# Patient Record
Sex: Male | Born: 1955
Health system: Southern US, Community
[De-identification: ages and names within clinical notes are randomized; demographics above are authoritative.]

## PROBLEM LIST (undated history)

## (undated) DIAGNOSIS — K219 Gastro-esophageal reflux disease without esophagitis: Secondary | ICD-10-CM

## (undated) DIAGNOSIS — K922 Gastrointestinal hemorrhage, unspecified: Secondary | ICD-10-CM

## (undated) DIAGNOSIS — K208 Other esophagitis without bleeding: Secondary | ICD-10-CM

## (undated) DIAGNOSIS — N1330 Unspecified hydronephrosis: Secondary | ICD-10-CM

## (undated) DIAGNOSIS — N179 Acute kidney failure, unspecified: Secondary | ICD-10-CM

## (undated) DIAGNOSIS — N400 Enlarged prostate without lower urinary tract symptoms: Secondary | ICD-10-CM

## (undated) DIAGNOSIS — C801 Malignant (primary) neoplasm, unspecified: Secondary | ICD-10-CM

## (undated) DIAGNOSIS — I10 Essential (primary) hypertension: Secondary | ICD-10-CM

## (undated) HISTORY — PX: TONSILLECTOMY: SUR1361

## (undated) HISTORY — DX: Unspecified hydronephrosis: N13.30

## (undated) HISTORY — DX: Acute kidney failure, unspecified: N17.9

## (undated) HISTORY — DX: Other esophagitis without bleeding: K20.80

## (undated) HISTORY — DX: Other esophagitis: K20.8

---

## 2006-09-14 DIAGNOSIS — N400 Enlarged prostate without lower urinary tract symptoms: Secondary | ICD-10-CM

## 2006-09-14 HISTORY — DX: Benign prostatic hyperplasia without lower urinary tract symptoms: N40.0

## 2017-01-08 ENCOUNTER — Inpatient Hospital Stay (HOSPITAL_COMMUNITY)
Admission: AD | Admit: 2017-01-08 | Discharge: 2017-01-14 | DRG: 392 | Disposition: A | Discharge status: Home / Self Care | Payer: Medicaid Other | Source: Other Acute Inpatient Hospital | Attending: Internal Medicine | Admitting: Internal Medicine

## 2017-01-08 ENCOUNTER — Inpatient Hospital Stay (HOSPITAL_COMMUNITY): Payer: Medicaid Other

## 2017-01-08 ENCOUNTER — Encounter (HOSPITAL_COMMUNITY): Payer: Self-pay

## 2017-01-08 DIAGNOSIS — I129 Hypertensive chronic kidney disease with stage 1 through stage 4 chronic kidney disease, or unspecified chronic kidney disease: Secondary | ICD-10-CM | POA: Diagnosis present

## 2017-01-08 DIAGNOSIS — Z833 Family history of diabetes mellitus: Secondary | ICD-10-CM | POA: Diagnosis not present

## 2017-01-08 DIAGNOSIS — R3989 Other symptoms and signs involving the genitourinary system: Secondary | ICD-10-CM | POA: Diagnosis present

## 2017-01-08 DIAGNOSIS — Z820 Family history of epilepsy and other diseases of the nervous system: Secondary | ICD-10-CM | POA: Diagnosis not present

## 2017-01-08 DIAGNOSIS — N183 Chronic kidney disease, stage 3 (moderate): Secondary | ICD-10-CM | POA: Diagnosis present

## 2017-01-08 DIAGNOSIS — E876 Hypokalemia: Secondary | ICD-10-CM | POA: Diagnosis not present

## 2017-01-08 DIAGNOSIS — Z791 Long term (current) use of non-steroidal anti-inflammatories (NSAID): Secondary | ICD-10-CM | POA: Diagnosis not present

## 2017-01-08 DIAGNOSIS — N320 Bladder-neck obstruction: Secondary | ICD-10-CM | POA: Diagnosis present

## 2017-01-08 DIAGNOSIS — K922 Gastrointestinal hemorrhage, unspecified: Secondary | ICD-10-CM | POA: Diagnosis not present

## 2017-01-08 DIAGNOSIS — K0889 Other specified disorders of teeth and supporting structures: Secondary | ICD-10-CM | POA: Diagnosis present

## 2017-01-08 DIAGNOSIS — R739 Hyperglycemia, unspecified: Secondary | ICD-10-CM | POA: Diagnosis present

## 2017-01-08 DIAGNOSIS — Z87891 Personal history of nicotine dependence: Secondary | ICD-10-CM

## 2017-01-08 DIAGNOSIS — K449 Diaphragmatic hernia without obstruction or gangrene: Secondary | ICD-10-CM | POA: Diagnosis present

## 2017-01-08 DIAGNOSIS — M549 Dorsalgia, unspecified: Secondary | ICD-10-CM | POA: Diagnosis present

## 2017-01-08 DIAGNOSIS — R590 Localized enlarged lymph nodes: Secondary | ICD-10-CM | POA: Diagnosis present

## 2017-01-08 DIAGNOSIS — N134 Hydroureter: Secondary | ICD-10-CM | POA: Diagnosis present

## 2017-01-08 DIAGNOSIS — N1330 Unspecified hydronephrosis: Secondary | ICD-10-CM | POA: Diagnosis present

## 2017-01-08 DIAGNOSIS — N400 Enlarged prostate without lower urinary tract symptoms: Secondary | ICD-10-CM | POA: Diagnosis present

## 2017-01-08 DIAGNOSIS — D72829 Elevated white blood cell count, unspecified: Secondary | ICD-10-CM | POA: Diagnosis present

## 2017-01-08 DIAGNOSIS — K210 Gastro-esophageal reflux disease with esophagitis: Secondary | ICD-10-CM | POA: Diagnosis present

## 2017-01-08 DIAGNOSIS — N401 Enlarged prostate with lower urinary tract symptoms: Secondary | ICD-10-CM | POA: Diagnosis present

## 2017-01-08 DIAGNOSIS — K209 Esophagitis, unspecified: Secondary | ICD-10-CM | POA: Diagnosis not present

## 2017-01-08 DIAGNOSIS — N179 Acute kidney failure, unspecified: Secondary | ICD-10-CM | POA: Diagnosis present

## 2017-01-08 DIAGNOSIS — K920 Hematemesis: Secondary | ICD-10-CM | POA: Diagnosis present

## 2017-01-08 DIAGNOSIS — R338 Other retention of urine: Secondary | ICD-10-CM | POA: Diagnosis present

## 2017-01-08 DIAGNOSIS — D62 Acute posthemorrhagic anemia: Secondary | ICD-10-CM | POA: Diagnosis present

## 2017-01-08 HISTORY — DX: Benign prostatic hyperplasia without lower urinary tract symptoms: N40.0

## 2017-01-08 HISTORY — DX: Essential (primary) hypertension: I10

## 2017-01-08 LAB — TYPE AND SCREEN
ABO/RH(D): O NEG
ANTIBODY SCREEN: NEGATIVE

## 2017-01-08 LAB — CBC WITH DIFFERENTIAL/PLATELET
Basophils Absolute: 0 10*3/uL (ref 0.0–0.1)
Basophils Relative: 0 %
EOS ABS: 0 10*3/uL (ref 0.0–0.7)
Eosinophils Relative: 0 %
HEMATOCRIT: 31.1 % — AB (ref 39.0–52.0)
HEMOGLOBIN: 10.9 g/dL — AB (ref 13.0–17.0)
LYMPHS ABS: 1.1 10*3/uL (ref 0.7–4.0)
Lymphocytes Relative: 7 %
MCH: 31 pg (ref 26.0–34.0)
MCHC: 35 g/dL (ref 30.0–36.0)
MCV: 88.4 fL (ref 78.0–100.0)
MONO ABS: 1.3 10*3/uL — AB (ref 0.1–1.0)
Monocytes Relative: 9 %
NEUTROS ABS: 13 10*3/uL — AB (ref 1.7–7.7)
NEUTROS PCT: 84 %
Platelets: 220 10*3/uL (ref 150–400)
RBC: 3.52 MIL/uL — ABNORMAL LOW (ref 4.22–5.81)
RDW: 12.3 % (ref 11.5–15.5)
WBC: 15.4 10*3/uL — ABNORMAL HIGH (ref 4.0–10.5)

## 2017-01-08 LAB — COMPREHENSIVE METABOLIC PANEL
ALK PHOS: 61 U/L (ref 38–126)
ALT: 18 U/L (ref 17–63)
ANION GAP: 15 (ref 5–15)
AST: 24 U/L (ref 15–41)
Albumin: 4.1 g/dL (ref 3.5–5.0)
BILIRUBIN TOTAL: 1.1 mg/dL (ref 0.3–1.2)
BUN: 56 mg/dL — ABNORMAL HIGH (ref 6–20)
CALCIUM: 9.5 mg/dL (ref 8.9–10.3)
CO2: 24 mmol/L (ref 22–32)
Chloride: 100 mmol/L — ABNORMAL LOW (ref 101–111)
Creatinine, Ser: 5.55 mg/dL — ABNORMAL HIGH (ref 0.61–1.24)
GFR calc non Af Amer: 10 mL/min — ABNORMAL LOW (ref >60)
GFR, EST AFRICAN AMERICAN: 12 mL/min — AB (ref >60)
Glucose, Bld: 131 mg/dL — ABNORMAL HIGH (ref 65–99)
Potassium: 3.8 mmol/L (ref 3.5–5.1)
Sodium: 139 mmol/L (ref 135–145)
TOTAL PROTEIN: 7.3 g/dL (ref 6.5–8.1)

## 2017-01-08 LAB — URINALYSIS, ROUTINE W REFLEX MICROSCOPIC
BACTERIA UA: NONE SEEN
BILIRUBIN URINE: NEGATIVE
Glucose, UA: NEGATIVE mg/dL
Ketones, ur: NEGATIVE mg/dL
Leukocytes, UA: NEGATIVE
Nitrite: NEGATIVE
Protein, ur: NEGATIVE mg/dL
SPECIFIC GRAVITY, URINE: 1.006 (ref 1.005–1.030)
SQUAMOUS EPITHELIAL / LPF: NONE SEEN
pH: 6 (ref 5.0–8.0)

## 2017-01-08 LAB — HEMOGLOBIN AND HEMATOCRIT, BLOOD
HCT: 31.1 % — ABNORMAL LOW (ref 39.0–52.0)
HCT: 30.8 % — ABNORMAL LOW (ref 39.0–52.0)
Hemoglobin: 10.9 g/dL — ABNORMAL LOW (ref 13.0–17.0)
Hemoglobin: 10.7 g/dL — ABNORMAL LOW (ref 13.0–17.0)

## 2017-01-08 LAB — ABO/RH: ABO/RH(D): O NEG

## 2017-01-08 LAB — PROTIME-INR
INR: 1.06
PROTHROMBIN TIME: 13.8 s (ref 11.4–15.2)

## 2017-01-08 LAB — HIV ANTIBODY (ROUTINE TESTING W REFLEX): HIV Screen 4th Generation wRfx: NONREACTIVE

## 2017-01-08 LAB — APTT: aPTT: 31 seconds (ref 24–36)

## 2017-01-08 MED ORDER — ALBUTEROL SULFATE (2.5 MG/3ML) 0.083% IN NEBU: 2.5000 mg | INHALATION_SOLUTION | RESPIRATORY_TRACT | Status: DC | PRN

## 2017-01-08 MED ORDER — ONDANSETRON HCL 4 MG PO TABS: 4.0000 mg | ORAL_TABLET | Freq: Four times a day (QID) | ORAL | Status: DC | PRN

## 2017-01-08 MED ORDER — PANTOPRAZOLE SODIUM 40 MG PO TBEC: 40.0000 mg | DELAYED_RELEASE_TABLET | Freq: Two times a day (BID) | ORAL | Status: DC

## 2017-01-08 MED ORDER — DEXTROSE 5 % IV SOLN
2.0000 g | INTRAVENOUS | Status: DC
  Administered 2017-01-08: 2 g via INTRAVENOUS
  Filled 2017-01-08: qty 2

## 2017-01-08 MED ORDER — SODIUM CHLORIDE 0.9 % IV SOLN
8.0000 mg/h | INTRAVENOUS | Status: DC
  Administered 2017-01-08: 8 mg/h via INTRAVENOUS
  Filled 2017-01-08 (×5): qty 80

## 2017-01-08 MED ORDER — ONDANSETRON HCL 4 MG/2ML IJ SOLN: 4.0000 mg | Freq: Four times a day (QID) | INTRAMUSCULAR | Status: DC | PRN

## 2017-01-08 MED ORDER — PANTOPRAZOLE SODIUM 40 MG IV SOLR: 40.0000 mg | Freq: Two times a day (BID) | INTRAVENOUS | Status: DC

## 2017-01-08 MED ORDER — PANTOPRAZOLE SODIUM 40 MG PO TBEC
40.0000 mg | DELAYED_RELEASE_TABLET | Freq: Two times a day (BID) | ORAL | Status: DC
  Administered 2017-01-09 – 2017-01-14 (×11): 40 mg via ORAL
  Filled 2017-01-08 (×11): qty 1

## 2017-01-08 MED ORDER — SODIUM CHLORIDE 0.9 % IV SOLN
8.0000 mg/h | INTRAVENOUS | Status: DC
  Administered 2017-01-08: 8 mg/h via INTRAVENOUS
  Filled 2017-01-08: qty 80

## 2017-01-08 MED ORDER — SODIUM CHLORIDE 0.9 % IV SOLN
INTRAVENOUS | Status: DC
  Administered 2017-01-08 – 2017-01-09 (×3): via INTRAVENOUS
  Administered 2017-01-09: 1 mL via INTRAVENOUS
  Administered 2017-01-10 (×2): via INTRAVENOUS

## 2017-01-08 NOTE — Care Management Note (Signed)
Case Management Note  Patient Details  Name: Todd Holden MRN: [id number] Date of Birth: [birth year]  Subjective/Objective:  GI bleed                  Action/Plan: Discharge Planning: NCM spoke to pt and lives at home with elderly mother. States he has follow up appt at [facility] in [place] on 01/19/2017 at 8:30 am. NCM encouraged pt to keep appt. Will check dc meds to see if [facility] letter needed for assistance with meds. Explained [place] program to pt. If meds are inexpensive, pt states he can afford. Please follow up with weekend NCM for assistance with [facility].    Expected Discharge Date:                 Expected Discharge Plan:  Home/Self Care  In-House Referral:  NA  Discharge planning Services  CM Consult, [place] Program  Post Acute Care Choice:  NA Choice offered to:  NA  DME Arranged:  N/A DME Agency:  NA  HH Arranged:  NA HH Agency:  NA  Status of Service:  In process, will continue to follow  If discussed at Long Length of Stay Meetings, dates discussed:    Additional Comments:  Erenest Rasher, RN 01/08/2017, 2:16 PM

## 2017-01-08 NOTE — H&P (Signed)
History and Physical    Todd Holden EXB:[id number] DOB: 11/30/1955 DOA: 01/08/2017  Referring MD/NP/PA: Dr. Myrene Buddy PCP: [facility] Not In System  Patient coming from: Transfer from [facility]  Chief Complaint: Vomiting black stuff  HPI: Todd Holden is a 61 y.o. male with medical history significant of HTN and BPH; who presents with complaints of 4-5 days of vomiting up black stuff. Patient notes that he's not been under the care of primary doctor in quite some time. He states that he's been vomiting black material at least 3 times per day. Unable to tolerate any oral food or liquids. During this time he reports having left-sided flank pain that he describes as throbbing and constant. He reports having issues with his prostate for some time. Reports normally only being able to urinate a small amount of urine at a time and is frequently up multiple times throughout the night. Here lately patient reports noticing a foul smell to his urine. Associated symptoms included generalized malaise, abdominal pain, subjective fevers, lightheadedness, take of this, and intermittent shortness of breath. Admits to use of [employer] brand Advil approximately 2 times per day for at least 1 week. Patient denies any alcohol, tobacco, or illicit drug use. He was initially seen at [facility] where patient was noted to have stable vital signs. Labs revealed increase 16.3, hemoglobin 11.5, BUN 55,  and creatinine 5. His baseline kidney function is unknown. His x-ray showed borderline cardiomegaly with no acute infiltrate or effusion. He received 80 mg bolus of Protonix and 1 L of IV fluids.The patient was transferred to [facility] to a med-surge bed for need of GI /nephrology consultative services.    ED Course: as seen above.  Review of Systems: As per HPI otherwise 10 point review of systems negative.   Past Medical History:  Diagnosis Date  . Hypertension   . Prostate enlargement 2008     Past Surgical History:  Procedure Laterality Date  . TONSILLECTOMY     61 years old     reports that he quit smoking about 40 years ago. His smoking use included Cigarettes. He smoked 1.00 pack per day. He has never used smokeless tobacco. He reports that he uses drugs, including Marijuana. He reports that he does not drink alcohol.  No Known Allergies  Family History  Problem Relation Age of Onset  . Diabetes Father   . Parkinson's disease Father     Prior to Admission medications   Not on File    Physical Exam: Constitutional: Elderly male who appears to be in mild distress repeatedly hiccuping  Vitals:   01/08/17 0056  BP: (!) 174/95  Pulse: 97  Resp: 18  Temp: 98.3 F (36.8 C)  TempSrc: Oral  SpO2: 99%  Weight: 86.2 kg (190 lb)  Height: 6\' 1"  (1.854 m)   Eyes: PERRL, lids and conjunctivae normal ENMT: Mucous membranes are dry, Posterior pharynx clear of any exudate or lesions. Poor dentition.  Neck: normal, supple, no masses, no thyromegaly Respiratory: clear to auscultation bilaterally, no wheezing, no crackles. Normal respiratory effort. No accessory muscle use.  Cardiovascular: Regular rate and rhythm, no murmurs / rubs / gallops. No extremity edema. 2+ pedal pulses. No carotid bruits.  Abdomen:  Positive left CVA tenderness, no masses palpated. No hepatosplenomegaly. Bowel sounds positive.  Musculoskeletal: no clubbing / cyanosis. No joint deformity upper and lower extremities. Good ROM, no contractures. Normal muscle tone.  Skin: no rashes, lesions, ulcers. No induration Neurologic: CN 2-12  grossly intact. Sensation intact, DTR normal. Strength 5/5 in all 4.  Psychiatric: Normal judgment and insight. Alert and oriented x 3. Normal mood.     Labs on Admission: I have personally reviewed following labs and imaging studies  CBC: No results for input(s): WBC, NEUTROABS, HGB, HCT, MCV, PLT in the last 168 hours. Basic Metabolic Panel: No results for  input(s): NA, K, CL, CO2, GLUCOSE, BUN, CREATININE, CALCIUM, MG, PHOS in the last 168 hours. GFR: CrCl cannot be calculated (No order found.). Liver Function Tests: No results for input(s): AST, ALT, ALKPHOS, BILITOT, PROT, ALBUMIN in the last 168 hours. No results for input(s): LIPASE, AMYLASE in the last 168 hours. No results for input(s): AMMONIA in the last 168 hours. Coagulation Profile: No results for input(s): INR, PROTIME in the last 168 hours. Cardiac Enzymes: No results for input(s): CKTOTAL, CKMB, CKMBINDEX, TROPONINI in the last 168 hours. BNP (last 3 results) No results for input(s): PROBNP in the last 8760 hours. HbA1C: No results for input(s): HGBA1C in the last 72 hours. CBG: No results for input(s): GLUCAP in the last 168 hours. Lipid Profile: No results for input(s): CHOL, HDL, LDLCALC, TRIG, CHOLHDL, LDLDIRECT in the last 72 hours. Thyroid Function Tests: No results for input(s): TSH, T4TOTAL, FREET4, T3FREE, THYROIDAB in the last 72 hours. Anemia Panel: No results for input(s): VITAMINB12, FOLATE, FERRITIN, TIBC, IRON, RETICCTPCT in the last 72 hours. Urine analysis: No results found for: COLORURINE, APPEARANCEUR, LABSPEC, PHURINE, GLUCOSEU, HGBUR, BILIRUBINUR, KETONESUR, PROTEINUR, UROBILINOGEN, NITRITE, LEUKOCYTESUR Sepsis Labs: No results found for this or any previous visit (from the past 240 hour(s)).   Radiological Exams on Admission: No results found.    Assessment/Plan Upper GI bleed with hematemesis: Acute. Patient presents with 5 days of hematemesis. Previous history of NSAID use noted. Patient currently hemodynamically stable. He'll be given 80 mg of Protonix IV 1 dose at outside facility. - Admit to MedSurg bed - NPO - Continuing protonix gtt per protocol - Zofran prn N/V - Will need to consult GI in a.m.  Acute blood loss anemia: Initial hemoglobin atOSF was noted to be 11.5. - Serial H&H's - Type and screen for possible need of blood  products  Leukocytosis with question of UTI/ pyelonephritis: Acute. WBC was initially noted to be elevated at 15 at the outside facility. No urinalysis was obtained. - Check stat urinalysis and urine culture - Empiric antibiotics of Rocephin for possible UTI, discontinue if no signs of infection - recheck CBC   Acute renal failure with urinary retention 2/2 BPH: Patient's initial creatinine noted to be elevated at 5 BUN of 55. No previous baseline known. - Place Foley catheter for suspected urinary retention - strict I&Os - I VF NS at 75 ml/hr  - Check renal ultrasound - May warrant formal consultation to nephrology at some point during hospitalization  DVT prophylaxis: SCD Code Status: Full Family Communication: No family present at bedside Disposition Plan: TBD  Consults called: None Admission status: Inpatient  Norval Morton MD [facility] Pager [phone]  If 7PM-7AM, please contact night-coverage [email or web address] Password [email or web address]  01/08/2017, 1:19 AM

## 2017-01-08 NOTE — Progress Notes (Signed)
Pharmacy Antibiotic Note  Todd Holden is a 61 y.o. male admitted on 01/08/2017 with UTI.  Pharmacy has been consulted for Ceftriaxone dosing. Rule out Pyelo.   Plan: -Ceftriaxone 2g IV q24h -F/U urine culture for directed therapy  Height: 6\' 1"  (185.4 cm) Weight: 190 lb (86.2 kg) IBW/kg (Calculated) : 79.9  Temp (24hrs), Avg:98.3 F (36.8 C), Min:98.3 F (36.8 C), Max:98.3 F (36.8 C)   [name] 01/08/2017 2:56 AM

## 2017-01-08 NOTE — Progress Notes (Signed)
Patient seen and evaluated earlier this a.m.   GI consulted and currently on board.  Patient evaluated and no new complaints reported.  We'll reassess next a.m.  Gen.: Patient in no acute distress, Cardiovascular: No cyanosis Pulmonary: No increased work of breathing, no wheezes  [place], [employer]

## 2017-01-08 NOTE — Consult Note (Signed)
[facility] Consult: 2:31 PM 01/08/2017  LOS: 0 days    Referring Provider: Dr Wendee Beavers  Primary Care Physician:   Primary Gastroenterologist:  unassigned   IMPRESSION:   *  Black, bloody emesis and black stools. Rule out ulcer disease, rule out neoplasia, rule out AVMs. Rule out Mallory-Weiss tear.  *  Normocytic anemia.  Is this from blood loss or due to what looks like CKD.    *  AKI. ?what element of his renal dysfunction is due to CKD?  *  Normocytic anemia  *  HTN  *  Hyperglycemia.  seerum glucoses in 120s - 130s.     PLAN:     *  EGD tomorrow as he had popsicle and a small amount of ginger ale within the last hour. .    *  Clears.  Switch to po Protonix in AM, has 10 hours left of Protonix drip (new bag just hung)   Azucena Freed  01/08/2017, 2:31 PM Pager: [place] Attending   I have taken an interval history, reviewed the chart and examined the patient. I agree with the Advanced Practitioner's note, impression and recommendations.    Certainly could be having coffee ground hematemesis   Reason for Consultation:  Black emesis   HPI: Todd Holden is a 61 y.o. male.  PMH HTN.   Prostamegaly.  No previous GI problems.  No previous EGD/Colonoscopy    This is the sixth day of pain in his left flank. Developed emesis on Monday. Initially this was clear but turned black in color and when he looked at the emesis in the commode there was a bloody/reddish cast to the color. This occurred 2-3 times a day throughout the week.  By Wednesday, 3 days into the vomiting, his stools became black and loose. At initial onset of the vomiting he felt vertigo but this resolved. He did not have chest pain, palpitations, shortness of breath. The left flank pain continued. He was unable to  make an appointment with the clinic in [place] until May. Yesterday evening he went to [facility] ED. BP 190s/90s, pulse 80s.  Hgb 11.5. Coags normal. LFTs normal.  Chemistries with either acute kidney injury and/or previously undiagnosed stage 4 CKD. He was not hyperkalemic.  Chest x-ray showed borderline cardiomegaly but no heart failure or pneumonia. He was transferred from [facility] to [place] for management of what was felt to be acute kidney failure.  Labs here at [facility]: Hgb 10.9 >> 10.7.  MCV 88.  Normal coags and platelets.   AKI vs CKD stage 4, GFR 10.    Patient uses ibuprofen 200 mg daily as needed for back pain and has used about 20 doses within the last month.  Patient does not suffer from heartburn. Up until this week his appetite was fine. There's been no weight loss. No NSAIDs.  ETOH at holidays 1 to 2 x per year.  No hx liver disease.  He does suffer from chronic prostate symptoms of dribbling urine, urinary urgency with ineffective voiding,  nocturia. He is known he's had prostate problems for several years. In terms of comparable labs, patient hasn't had any blood work in many years. He doesn't go to the doctor so he is unsure if he has previously undiagnosed hypertension.   Fm Hx negative for colorectal cancers, ulcer disease or GI bleeds.  Past Medical History:  Diagnosis Date  . Hypertension   . Prostate enlargement 2008    Past Surgical History:  Procedure Laterality Date  . TONSILLECTOMY     61 years old    Prior to Admission medications   Medication Sig Start Date End Date Taking? Authorizing Provider  ibuprofen (ADVIL,MOTRIN) 200 MG tablet Take 200-400 mg by mouth every 6 (six) hours as needed for headache or moderate pain.   Yes Historical Provider, MD    Scheduled Meds: . [START ON 01/11/2017] pantoprazole  40 mg Intravenous Q12H   Infusions: . sodium chloride 75 mL/hr at 01/08/17 0155  . pantoprozole (PROTONIX) infusion 8 mg/hr (01/08/17 0346)   PRN  Meds: albuterol, ondansetron **OR** ondansetron (ZOFRAN) IV   Allergies as of 01/07/2017  . (Not on File)    Family History  Problem Relation Age of Onset  . Diabetes Father   . Parkinson's disease Father     Social History   Social History  . Marital status: Divorced    Spouse name: N/A  . Number of children: N/A  . Years of education: N/A   Occupational History  . Not on file.   Social History Main Topics  . Smoking status: Former Smoker    Packs/day: 1.00    Types: Cigarettes    Quit date: 62  . Smokeless tobacco: Never Used  . Alcohol use No  . Drug use: Yes    Types: Marijuana     Comment: quit 06/2016   REVIEW OF SYSTEMS: Constitutional:  Some weakness. ENT:  No nose bleeds.  Periodic dental pain from poor dentition. Pulm:  No DOE, no cough CV:  No palpitations, no LE edema.  GU:  No hematuria, no frequency GI:  Per HPI Heme:  No unusual or excessive bleeding or bruising   Transfusions:  None ever Neuro:  No headaches, no peripheral tingling or numbness.  Resolve vertigo as per HPI Derm:  No itching, no rash or sores.  Endocrine:  No sweats or chills.  No polyuria or dysuria Immunization:  None recently Travel:  None beyond local counties in last few months.    PHYSICAL EXAM: Vital signs in last 24 hours: Vitals:   01/08/17 0511 01/08/17 1009  BP: (!) 187/91 (!) 163/90  Pulse: 99 87  Resp: 18 18  Temp: 98.3 F (36.8 C) 98.4 F (36.9 C)   Wt Readings from Last 3 Encounters:  01/08/17 86.2 kg (190 lb)    General: Pleasant, looks well. Head:  No asymmetry or swelling. No signs of head trauma.  Eyes:  No scleral icterus, no conjunctival pallor Ears:  No hearing deficit  Nose:  No congestion or discharge. Mouth:  Moist, clear oral mucosa. Moderately poor dentition. Tongue midline. Neck:  No JVD, thyromegaly or masses. Lungs:  Clear bilaterally. No shortness of breath or cough. Heart: RRR. No MRG. S1, S2 present. Abdomen:  Soft. Slight  lateral left tenderness. No masses. No HSM. Active bowel sounds. Not distended or obese.Marland Kitchen   Rectal: Deferred   Musc/Skeltl: No gross joint swelling or deformity. Extremities:  No CCE.  Neurologic:  Oriented times 3. No tremor. No limb weakness. No gross  deficits. Skin:  No sores. No telangiectasias. No suspicious lesions. Tattoos:  None Nodes:  No cervical adenopathy.   Psych:  Pleasant, calm, cooperative. Good historian.  Intake/Output from previous day: 04/26 0701 - 04/27 0700 In: 480.4 [I.V.:430.4; IV Piggyback:50] Out: 2300 [Urine:2300] Intake/Output this shift: Total I/O In: 0  Out: 3550 [Urine:3550]  LAB RESULTS:  Recent Labs  01/08/17 0257 01/08/17 0617 01/08/17 1223  WBC 15.4*  --   --   HGB 10.9* 10.9* 10.7*  HCT 31.1* 31.1* 30.8*  PLT 220  --   --    BMET Lab Results  Component Value Date   NA 139 01/08/2017   K 3.8 01/08/2017   CL 100 (L) 01/08/2017   CO2 24 01/08/2017   GLUCOSE 131 (H) 01/08/2017   BUN 56 (H) 01/08/2017   CREATININE 5.55 (H) 01/08/2017   CALCIUM 9.5 01/08/2017   LFT  Recent Labs  01/08/17 0257  PROT 7.3  ALBUMIN 4.1  AST 24  ALT 18  ALKPHOS 61  BILITOT 1.1   PT/INR Lab Results  Component Value Date   INR 1.06 01/08/2017     RADIOLOGY STUDIES: US Renal  Result Date: 01/08/2017 CLINICAL DATA:  Acute renal failure. EXAM: RENAL / URINARY TRACT ULTRASOUND COMPLETE COMPARISON:  None. FINDINGS: Right Kidney: Length: 12.9 cm. Echogenicity within normal limits. Moderate right-sided hydronephrosis is present. The proximal right ureter is dilated as well. No obstructing lesion is evident. Parenchymal echogenicity is within normal limits. Left Kidney: Length: 10.9 cm, within normal limits. Echogenicity within normal limits. No mass is visualized. A small amount of perinephric fluid is noted at the lower pole. Mild left-sided hydronephrosis is present. No obstructing lesion is evident. Bladder: A Foley catheter is present. IMPRESSION:  1. Bilateral hydronephrosis, right greater than left. 2. Mild perinephric fluid at the lower pole of the left kidney. 3. No obstructing lesions are visualized. 4. Foley catheter is present within the bladder. Electronically Signed   By: San Morelle M.D.   On: 01/08/2017 11:30

## 2017-01-09 ENCOUNTER — Encounter (HOSPITAL_COMMUNITY): Payer: Self-pay | Admitting: *Deleted

## 2017-01-09 ENCOUNTER — Encounter (HOSPITAL_COMMUNITY)
Admission: AD | Disposition: A | Discharge status: Home / Self Care | Payer: Self-pay | Source: Other Acute Inpatient Hospital | Attending: Family Medicine

## 2017-01-09 DIAGNOSIS — K922 Gastrointestinal hemorrhage, unspecified: Secondary | ICD-10-CM

## 2017-01-09 HISTORY — PX: ESOPHAGOGASTRODUODENOSCOPY: SHX5428

## 2017-01-09 HISTORY — DX: Gastrointestinal hemorrhage, unspecified: K92.2

## 2017-01-09 LAB — CBC
HEMATOCRIT: 31.5 % — AB (ref 39.0–52.0)
HEMOGLOBIN: 10.7 g/dL — AB (ref 13.0–17.0)
MCH: 30.7 pg (ref 26.0–34.0)
MCHC: 34 g/dL (ref 30.0–36.0)
MCV: 90.3 fL (ref 78.0–100.0)
PLATELETS: 230 10*3/uL (ref 150–400)
RBC: 3.49 MIL/uL — AB (ref 4.22–5.81)
RDW: 12.6 % (ref 11.5–15.5)
WBC: 12.2 10*3/uL — AB (ref 4.0–10.5)

## 2017-01-09 LAB — URINE CULTURE: Culture: NO GROWTH

## 2017-01-09 LAB — BASIC METABOLIC PANEL
ANION GAP: 10 (ref 5–15)
BUN: 34 mg/dL — ABNORMAL HIGH (ref 6–20)
CALCIUM: 8.9 mg/dL (ref 8.9–10.3)
CHLORIDE: 108 mmol/L (ref 101–111)
CO2: 24 mmol/L (ref 22–32)
Creatinine, Ser: 2.62 mg/dL — ABNORMAL HIGH (ref 0.61–1.24)
GFR calc Af Amer: 29 mL/min — ABNORMAL LOW (ref >60)
GFR calc non Af Amer: 25 mL/min — ABNORMAL LOW (ref >60)
GLUCOSE: 105 mg/dL — AB (ref 65–99)
Potassium: 3.3 mmol/L — ABNORMAL LOW (ref 3.5–5.1)
Sodium: 142 mmol/L (ref 135–145)

## 2017-01-09 SURGERY — EGD (ESOPHAGOGASTRODUODENOSCOPY)
Anesthesia: Moderate Sedation

## 2017-01-09 MED ORDER — BUTAMBEN-TETRACAINE-BENZOCAINE 2-2-14 % EX AERO
INHALATION_SPRAY | CUTANEOUS | Status: DC | PRN
  Administered 2017-01-09: 2 via TOPICAL

## 2017-01-09 MED ORDER — DIPHENHYDRAMINE HCL 50 MG/ML IJ SOLN
INTRAMUSCULAR | Status: DC | PRN
  Administered 2017-01-09: 25 mg via INTRAVENOUS

## 2017-01-09 MED ORDER — DIPHENHYDRAMINE HCL 50 MG/ML IJ SOLN
INTRAMUSCULAR | Status: AC
  Filled 2017-01-09: qty 1

## 2017-01-09 MED ORDER — SODIUM CHLORIDE 0.9 % IJ SOLN
PREFILLED_SYRINGE | INTRAMUSCULAR | Status: DC | PRN
  Administered 2017-01-09: 3 mL

## 2017-01-09 MED ORDER — FENTANYL CITRATE (PF) 100 MCG/2ML IJ SOLN
INTRAMUSCULAR | Status: AC
  Filled 2017-01-09: qty 4

## 2017-01-09 MED ORDER — EPINEPHRINE PF 1 MG/10ML IJ SOSY
PREFILLED_SYRINGE | INTRAMUSCULAR | Status: AC
  Filled 2017-01-09: qty 20

## 2017-01-09 MED ORDER — SPOT INK MARKER SYRINGE KIT
PACK | SUBMUCOSAL | Status: AC
  Filled 2017-01-09: qty 5

## 2017-01-09 MED ORDER — MIDAZOLAM HCL 5 MG/ML IJ SOLN
INTRAMUSCULAR | Status: AC
  Filled 2017-01-09: qty 2

## 2017-01-09 MED ORDER — FENTANYL CITRATE (PF) 100 MCG/2ML IJ SOLN
INTRAMUSCULAR | Status: DC | PRN
  Administered 2017-01-09 (×3): 25 ug via INTRAVENOUS

## 2017-01-09 MED ORDER — MIDAZOLAM HCL 10 MG/2ML IJ SOLN
INTRAMUSCULAR | Status: DC | PRN
  Administered 2017-01-09 (×2): 2 mg via INTRAVENOUS
  Administered 2017-01-09: 1 mg via INTRAVENOUS

## 2017-01-09 NOTE — Progress Notes (Signed)
Pt back from EGD.

## 2017-01-09 NOTE — Op Note (Signed)
[facility] Patient Name: Todd Holden Procedure Date : 01/09/2017 MRN: [id number] Attending MD: Carol Ada , MD Date of Birth: Jul 12, 1956 CSN: [id number] Age: 61 Admit Type: Inpatient Procedure:                Upper GI endoscopy Indications:              Hematemesis Providers:                Carol Ada, MD, Kingsley Plan, RN, Nevin Bloodgood, Technician, Cherylynn Ridges, Technician Referring MD:              Medicines:                Fentanyl 75 micrograms IV, Midazolam 5 mg IV,                            Diphenhydramine 25 mg IV Complications:            No immediate complications. Estimated Blood Loss:     Estimated blood loss was minimal. Procedure:                Pre-Anesthesia Assessment:                           - Prior to the procedure, a History and Physical                            was performed, and patient medications and                            allergies were reviewed. The patient's tolerance of                            previous anesthesia was also reviewed. The risks                            and benefits of the procedure and the sedation                            options and risks were discussed with the patient.                            All questions were answered, and informed consent                            was obtained. Prior Anticoagulants: The patient has                            taken no previous anticoagulant or antiplatelet                            agents. ASA Grade Assessment: II - A patient with  mild systemic disease. After reviewing the risks                            and benefits, the patient was deemed in                            satisfactory condition to undergo the procedure.                           - Sedation was administered by an endoscopy nurse.                            The sedation level attained was moderate.                           After obtaining  informed consent, the endoscope was                            passed under direct vision. Throughout the                            procedure, the patient's blood pressure, pulse, and                            oxygen saturations were monitored continuously. The                            EG-[id number] ([id number]) scope was introduced through the                            mouth, and advanced to the second part of duodenum.                            The upper GI endoscopy was accomplished without                            difficulty. The patient tolerated the procedure                            well. Scope In: Scope Out: Findings:      LA Grade D (one or more mucosal breaks involving at least 75% of       esophageal circumference) esophagitis with bleeding was found. Area was       successfully injected with 3 mL of a 1:10,000 solution of epinephrine       for hemostasis. For hemostasis, one hemostatic clip was successfully       placed (MR unsafe). There was no bleeding at the end of the procedure.      A 3 cm hiatal hernia was present.      The stomach was normal.      The examined duodenum was normal.      Initial entry into the esophagus revealed an LA Grade D esophagitis up       to 30 cm. The distal esophagus was very friable and with mild contact  with the endoscope arterial spurting resulted. The mucoa was very thin       and sloughed easily. I made decision to inject first to control the       bleeding. One ml of 1:10,000 Epi markedly slowed the bleeding. An       initial attempt to clip the area failed as the mucosa sloughed.       Acquisition of deeper tissue failed with this attempt. A second attempt       failed as the clip malfunctioned. A final third attempt was successful       in clipping the site. An additional 2 ml of Epi was injected adjacent to       the clip to further ensure hemostasis. Impression:               - LA Grade D reflux esophagitis. Injected. Clip  (MR                            unsafe) was placed.                           - 3 cm hiatal hernia.                           - Normal stomach.                           - Normal examined duodenum.                           - No specimens collected. Moderate Sedation:      Moderate (conscious) sedation was administered by the endoscopy nurse       and supervised by the endoscopist. The following parameters were       monitored: oxygen saturation, heart rate, blood pressure, and response       to care. Recommendation:           - Return patient to hospital ward for ongoing care.                           - NPO.                           - Continue present medications.                           - Follow HGB. Procedure Code(s):        --- Professional ---                           [phone], Esophagogastroduodenoscopy, flexible,                            transoral; with control of bleeding, any method Diagnosis Code(s):        --- Professional ---                           K21.0, Gastro-esophageal reflux disease with  esophagitis                           K44.9, Diaphragmatic hernia without obstruction or                            gangrene                           K92.0, Hematemesis CPT copyright 2016 American [employer] Association. All rights reserved. The codes documented in this report are preliminary and upon coder review may  be revised to meet current compliance requirements. Carol Ada, MD Carol Ada, MD 01/09/2017 8:32:14 AM This report has been signed electronically. Number of Addenda: 0

## 2017-01-09 NOTE — Progress Notes (Signed)
PROGRESS NOTE    Todd Holden  JKK:[id number] DOB: 31-May-1956 DOA: 01/08/2017 PCP: Pcp Not In System    Brief Narrative:  61 y.o. male with medical history significant of HTN and BPH; who presents with complaints of 4-5 days of vomiting up black stuff. Patient notes that he's not been under the care of primary doctor in quite some time. He states that he's been vomiting black material at least 3 times per day.  Assessment & Plan:   Principal Problem:   GI bleed - Pt is s/p EGD - Had clip placed. Plan is to monitor blood levels after procedure. GI managing diet - Pt on protonix  Active Problems:   Hematemesis - resolved    Acute blood loss anemia - Blood levels stable    Acute renal failure (ARF) (HCC) - Much improved.  - reassess next am.    BPH (benign prostatic hyperplasia) - stable    Leukocytosis - None   DVT prophylaxis: SCD's Code Status: Full Family Communication: None at bedside Disposition Plan: pending improvement in condition   Consultants:   GI   Procedures: EGD   Antimicrobials: None   Subjective: Pt has no new complaints. No acute issues overnight.  Objective: Vitals:   01/09/17 0820 01/09/17 0830 01/09/17 0835 01/09/17 1356  BP: (!) 176/72 (!) 148/61 (!) 154/77 139/78  Pulse: 99 100 98 85  Resp: 14 13 14 16   Temp: 98 F (36.7 C)   98 F (36.7 C)  TempSrc: Oral   Oral  SpO2: 100% 100% 100% 100%  Weight:      Height:        Intake/Output Summary (Last 24 hours) at 01/09/17 1656 Last data filed at 01/09/17 1428  Gross per 24 hour  Intake              927 ml  Output             3300 ml  Net            -2373 ml   Filed Weights   01/08/17 0056 01/09/17 0740  Weight: 86.2 kg (190 lb) 86.2 kg (190 lb)    Examination:  General exam: Appears calm and comfortable, in nad. Respiratory system: Clear to auscultation. Respiratory effort normal. Cardiovascular system: S1 & S2 heard, RRR. No JVD, murmurs, rubs, gallops or  clicks. No pedal edema. Gastrointestinal system: Abdomen is nondistended, soft and nontender.  Normal bowel sounds heard. Central nervous system: Alert and oriented. No focal neurological deficits. Extremities: Symmetric 5 x 5 power. Skin: No rashes, lesions or ulcers, on limited exam. Psychiatry: Judgement and insight appear normal. Mood & affect appropriate.    Data Reviewed: I have personally reviewed following labs and imaging studies  CBC:  Recent Labs Lab 01/08/17 0257 01/08/17 0617 01/08/17 1223 01/09/17 0554  WBC 15.4*  --   --  12.2*  NEUTROABS 13.0*  --   --   --   HGB 10.9* 10.9* 10.7* 10.7*  HCT 31.1* 31.1* 30.8* 31.5*  MCV 88.4  --   --  90.3  PLT 220  --   --  716   Basic Metabolic Panel:  Recent Labs Lab 01/08/17 0257 01/09/17 0554  NA 139 142  K 3.8 3.3*  CL 100* 108  CO2 24 24  GLUCOSE 131* 105*  BUN 56* 34*  CREATININE 5.55* 2.62*  CALCIUM 9.5 8.9   GFR: Estimated Creatinine Clearance: 33.9 mL/min (A) (by C-G formula based on SCr of 2.62  mg/dL (H)). Liver Function Tests:  Recent Labs Lab 01/08/17 0257  AST 24  ALT 18  ALKPHOS 61  BILITOT 1.1  PROT 7.3  ALBUMIN 4.1   No results for input(s): LIPASE, AMYLASE in the last 168 hours. No results for input(s): AMMONIA in the last 168 hours. Coagulation Profile:  Recent Labs Lab 01/08/17 0617  INR 1.06   Cardiac Enzymes: No results for input(s): CKTOTAL, CKMB, CKMBINDEX, TROPONINI in the last 168 hours. BNP (last 3 results) No results for input(s): PROBNP in the last 8760 hours. HbA1C: No results for input(s): HGBA1C in the last 72 hours. CBG: No results for input(s): GLUCAP in the last 168 hours. Lipid Profile: No results for input(s): CHOL, HDL, LDLCALC, TRIG, CHOLHDL, LDLDIRECT in the last 72 hours. Thyroid Function Tests: No results for input(s): TSH, T4TOTAL, FREET4, T3FREE, THYROIDAB in the last 72 hours. Anemia Panel: No results for input(s): VITAMINB12, FOLATE, FERRITIN,  TIBC, IRON, RETICCTPCT in the last 72 hours. Sepsis Labs: No results for input(s): PROCALCITON, LATICACIDVEN in the last 168 hours.  Recent Results (from the past 240 hour(s))  Urine culture     Status: None   Collection Time: 01/08/17  5:15 AM  Result Value Ref Range Status   Specimen Description URINE, CATHETERIZED  Final   Special Requests NONE  Final   Culture NO GROWTH  Final   Report Status 01/09/2017 FINAL  Final    Radiology Studies: US Renal  Result Date: 01/08/2017 CLINICAL DATA:  Acute renal failure. EXAM: RENAL / URINARY TRACT ULTRASOUND COMPLETE COMPARISON:  None. FINDINGS: Right Kidney: Length: 12.9 cm. Echogenicity within normal limits. Moderate right-sided hydronephrosis is present. The proximal right ureter is dilated as well. No obstructing lesion is evident. Parenchymal echogenicity is within normal limits. Left Kidney: Length: 10.9 cm, within normal limits. Echogenicity within normal limits. No mass is visualized. A small amount of perinephric fluid is noted at the lower pole. Mild left-sided hydronephrosis is present. No obstructing lesion is evident. Bladder: A Foley catheter is present. IMPRESSION: 1. Bilateral hydronephrosis, right greater than left. 2. Mild perinephric fluid at the lower pole of the left kidney. 3. No obstructing lesions are visualized. 4. Foley catheter is present within the bladder. Electronically Signed   By: San Morelle M.D.   On: 01/08/2017 11:30   Scheduled Meds: . pantoprazole  40 mg Oral BID   Continuous Infusions: . sodium chloride 75 mL/hr at 01/09/17 0523     LOS: 1 day    Time spent: > 35 minutes Velvet Bathe, MD [facility] Pager [phone]  If 7PM-7AM, please contact night-coverage [email or web address] Password [facility] 01/09/2017, 4:56 PM

## 2017-01-09 NOTE — Progress Notes (Signed)
Pt transfer to EGD.

## 2017-01-09 NOTE — H&P (View-Only) (Signed)
Todd Holden: 2:31 PM 01/08/2017  LOS: 0 days    Referring Provider: Dr Wendee Beavers  Primary Care Physician:   Primary Gastroenterologist:  unassigned   IMPRESSION:   *  Black, bloody emesis and black stools. Rule out ulcer disease, rule out neoplasia, rule out AVMs. Rule out Mallory-Weiss tear.  *  Normocytic anemia.  Is this from blood loss or due to what looks like CKD.    *  AKI. ?what element of his renal dysfunction is due to CKD?  *  Normocytic anemia  *  HTN  *  Hyperglycemia.  seerum glucoses in 120s - 130s.     PLAN:     *  EGD tomorrow as he had popsicle and a small amount of ginger ale within the last hour. .    *  Clears.  Switch to po Protonix in AM, has 10 hours left of Protonix drip (new bag just hung)   Azucena Freed  01/08/2017, 2:31 PM Pager: [place] Attending   I have taken an interval history, reviewed the chart and examined the patient. I agree with the Advanced Practitioner's note, impression and recommendations.    Certainly could be having coffee ground hematemesis   Reason for Consultation:  Black emesis   HPI: Todd Holden is a 61 y.o. male.  PMH HTN.   Prostamegaly.  No previous GI problems.  No previous EGD/Colonoscopy    This is the sixth day of pain in his left flank. Developed emesis on Monday. Initially this was clear but turned black in color and when he looked at the emesis in the commode there was a bloody/reddish cast to the color. This occurred 2-3 times a day throughout the week.  By Wednesday, 3 days into the vomiting, his stools became black and loose. At initial onset of the vomiting he felt vertigo but this resolved. He did not have chest pain, palpitations, shortness of breath. The left flank pain continued. He was unable to  make an appointment with the clinic in [place] until May. Yesterday evening he went to [facility] ED. BP 190s/90s, pulse 80s.  Hgb 11.5. Coags normal. LFTs normal.  Chemistries with either acute kidney injury and/or previously undiagnosed stage 4 CKD. He was not hyperkalemic.  Chest x-ray showed borderline cardiomegaly but no heart failure or pneumonia. He was [facility] to [place] for management of what was felt to be acute kidney failure.  Labs here at [facility]: Hgb 10.9 >> 10.7.  MCV 88.  Normal coags and platelets.   AKI vs CKD stage 4, GFR 10.    Patient uses ibuprofen 200 mg daily as needed for back pain and has used about 20 doses within the last month.  Patient does not suffer from heartburn. Up until this week his appetite was fine. There's been no weight loss. No NSAIDs.  ETOH at holidays 1 to 2 x per year.  No hx liver disease.  He does suffer from chronic prostate symptoms of dribbling urine, urinary urgency with ineffective voiding,  nocturia. He is known he's had prostate problems for several years. In terms of comparable labs, patient hasn't had any blood work in many years. He doesn't go to the doctor so he is unsure if he has previously undiagnosed hypertension.   Fm Hx negative for colorectal cancers, ulcer disease or GI bleeds.  Past Medical History:  Diagnosis Date  . Hypertension   . Prostate enlargement 2008    Past Surgical History:  Procedure Laterality Date  . TONSILLECTOMY     61 years old    Prior to Admission medications   Medication Sig Start Date End Date Taking? Authorizing Provider  ibuprofen (ADVIL,MOTRIN) 200 MG tablet Take 200-400 mg by mouth every 6 (six) hours as needed for headache or moderate pain.   Yes Historical Provider, MD    Scheduled Meds: . [START ON 01/11/2017] pantoprazole  40 mg Intravenous Q12H   Infusions: . sodium chloride 75 mL/hr at 01/08/17 0155  . pantoprozole (PROTONIX) infusion 8 mg/hr (01/08/17 0346)   PRN  Meds: albuterol, ondansetron **OR** ondansetron (ZOFRAN) IV   Allergies as of 01/07/2017  . (Not on File)    Family History  Problem Relation Age of Onset  . Diabetes Father   . Parkinson's disease Father     Social History   Social History  . Marital status: Divorced    Spouse name: N/A  . Number of children: N/A  . Years of education: N/A   Occupational History  . Not on file.   Social History Main Topics  . Smoking status: Former Smoker    Packs/day: 1.00    Types: Cigarettes    Quit date: 35  . Smokeless tobacco: Never Used  . Alcohol use No  . Drug use: Yes    Types: Marijuana     Comment: quit 06/2016   REVIEW OF SYSTEMS: Constitutional:  Some weakness. ENT:  No nose bleeds.  Periodic dental pain from poor dentition. Pulm:  No DOE, no cough CV:  No palpitations, no LE edema.  GU:  No hematuria, no frequency GI:  Per HPI Heme:  No unusual or excessive bleeding or bruising   Transfusions:  None ever Neuro:  No headaches, no peripheral tingling or numbness.  Resolve vertigo as per HPI Derm:  No itching, no rash or sores.  Endocrine:  No sweats or chills.  No polyuria or dysuria Immunization:  None recently Travel:  None beyond local counties in last few months.    PHYSICAL EXAM: Vital signs in last 24 hours: Vitals:   01/08/17 0511 01/08/17 1009  BP: (!) 187/91 (!) 163/90  Pulse: 99 87  Resp: 18 18  Temp: 98.3 F (36.8 C) 98.4 F (36.9 C)   Wt Readings from Last 3 Encounters:  01/08/17 86.2 kg (190 lb)    General: Pleasant, looks well. Head:  No asymmetry or swelling. No signs of head trauma.  Eyes:  No scleral icterus, no conjunctival pallor Ears:  No hearing deficit  Nose:  No congestion or discharge. Mouth:  Moist, clear oral mucosa. Moderately poor dentition. Tongue midline. Neck:  No JVD, thyromegaly or masses. Lungs:  Clear bilaterally. No shortness of breath or cough. Heart: RRR. No MRG. S1, S2 present. Abdomen:  Soft. Slight  lateral left tenderness. No masses. No HSM. Active bowel sounds. Not distended or obese.Marland Kitchen   Rectal: Deferred   Musc/Skeltl: No gross joint swelling or deformity. Extremities:  No CCE.  Neurologic:  Oriented times 3. No tremor. No limb weakness. No gross  deficits. Skin:  No sores. No telangiectasias. No suspicious lesions. Tattoos:  None Nodes:  No cervical adenopathy.   Psych:  Pleasant, calm, cooperative. Good historian.  Intake/Output from previous day: 04/26 0701 - 04/27 0700 In: 480.4 [I.V.:430.4; IV Piggyback:50] Out: 2300 [Urine:2300] Intake/Output this shift: Total I/O In: 0  Out: 3550 [Urine:3550]  LAB RESULTS:  Recent Labs  01/08/17 0257 01/08/17 0617 01/08/17 1223  WBC 15.4*  --   --   HGB 10.9* 10.9* 10.7*  HCT 31.1* 31.1* 30.8*  PLT 220  --   --    BMET Lab Results  Component Value Date   NA 139 01/08/2017   K 3.8 01/08/2017   CL 100 (L) 01/08/2017   CO2 24 01/08/2017   GLUCOSE 131 (H) 01/08/2017   BUN 56 (H) 01/08/2017   CREATININE 5.55 (H) 01/08/2017   CALCIUM 9.5 01/08/2017   LFT  Recent Labs  01/08/17 0257  PROT 7.3  ALBUMIN 4.1  AST 24  ALT 18  ALKPHOS 61  BILITOT 1.1   PT/INR Lab Results  Component Value Date   INR 1.06 01/08/2017     RADIOLOGY STUDIES: US Renal  Result Date: 01/08/2017 CLINICAL DATA:  Acute renal failure. EXAM: RENAL / URINARY TRACT ULTRASOUND COMPLETE COMPARISON:  None. FINDINGS: Right Kidney: Length: 12.9 cm. Echogenicity within normal limits. Moderate right-sided hydronephrosis is present. The proximal right ureter is dilated as well. No obstructing lesion is evident. Parenchymal echogenicity is within normal limits. Left Kidney: Length: 10.9 cm, within normal limits. Echogenicity within normal limits. No mass is visualized. A small amount of perinephric fluid is noted at the lower pole. Mild left-sided hydronephrosis is present. No obstructing lesion is evident. Bladder: A Foley catheter is present. IMPRESSION:  1. Bilateral hydronephrosis, right greater than left. 2. Mild perinephric fluid at the lower pole of the left kidney. 3. No obstructing lesions are visualized. 4. Foley catheter is present within the bladder. Electronically Signed   By: San Morelle M.D.   On: 01/08/2017 11:30

## 2017-01-09 NOTE — Interval H&P Note (Signed)
History and Physical Interval Note:  01/09/2017 8:01 AM  Todd Holden  has presented today for surgery, with the diagnosis of hemetemesis, melena.  The various methods of treatment have been discussed with the patient and family. After consideration of risks, benefits and other options for treatment, the patient has consented to  Procedure(s): ESOPHAGOGASTRODUODENOSCOPY (EGD) (N/A) as a surgical intervention .  The patient's history has been reviewed, patient examined, no change in status, stable for surgery.  I have reviewed the patient's chart and labs.  Questions were answered to the patient's satisfaction.     Hutson Luft D

## 2017-01-09 NOTE — Progress Notes (Signed)
Fentanyl 66mcg wasted with Johnnye Sima, RN.

## 2017-01-10 LAB — BASIC METABOLIC PANEL
Anion gap: 10 (ref 5–15)
BUN: 24 mg/dL — AB (ref 6–20)
CHLORIDE: 109 mmol/L (ref 101–111)
CO2: 25 mmol/L (ref 22–32)
Calcium: 9.1 mg/dL (ref 8.9–10.3)
Creatinine, Ser: 2.14 mg/dL — ABNORMAL HIGH (ref 0.61–1.24)
GFR calc Af Amer: 37 mL/min — ABNORMAL LOW (ref >60)
GFR calc non Af Amer: 32 mL/min — ABNORMAL LOW (ref >60)
GLUCOSE: 94 mg/dL (ref 65–99)
POTASSIUM: 3.1 mmol/L — AB (ref 3.5–5.1)
Sodium: 144 mmol/L (ref 135–145)

## 2017-01-10 LAB — CBC
HEMATOCRIT: 32.2 % — AB (ref 39.0–52.0)
HEMOGLOBIN: 11.1 g/dL — AB (ref 13.0–17.0)
MCH: 31.4 pg (ref 26.0–34.0)
MCHC: 34.5 g/dL (ref 30.0–36.0)
MCV: 91 fL (ref 78.0–100.0)
PLATELETS: 255 10*3/uL (ref 150–400)
RBC: 3.54 MIL/uL — AB (ref 4.22–5.81)
RDW: 12.6 % (ref 11.5–15.5)
WBC: 10.6 10*3/uL — ABNORMAL HIGH (ref 4.0–10.5)

## 2017-01-10 NOTE — Progress Notes (Signed)
Subjective: Feeling much better.  No complaints.  Objective: Vital signs in last 24 hours: Temp:  [97.7 F (36.5 C)-99.8 F (37.7 C)] 97.7 F (36.5 C) (04/29 0431) Pulse Rate:  [78-98] 78 (04/29 0431) Resp:  [14-16] 16 (04/29 0431) BP: (139-166)/(77-86) 160/86 (04/29 0431) SpO2:  [99 %-100 %] 100 % (04/29 0431) Last BM Date: 01/07/17  Intake/Output from previous day: 04/28 0701 - 04/29 0700 In: 1846.3 [P.O.:60; I.V.:1786.3] Out: 3734 [Urine:3475] Intake/Output this shift: No intake/output data recorded.  General appearance: alert and no distress Resp: clear to auscultation bilaterally Cardio: regular rate and rhythm GI: soft, non-tender; bowel sounds normal; no masses,  no organomegaly Extremities: extremities normal, atraumatic, no cyanosis or edema  Lab Results:  Recent Labs  01/08/17 0257  01/08/17 1223 01/09/17 0554 01/10/17 0418  WBC 15.4*  --   --  12.2* 10.6*  HGB 10.9*  < > 10.7* 10.7* 11.1*  HCT 31.1*  < > 30.8* 31.5* 32.2*  PLT 220  --   --  230 255  < > = values in this interval not displayed. BMET  Recent Labs  01/08/17 0257 01/09/17 0554 01/10/17 0418  NA 139 142 144  K 3.8 3.3* 3.1*  CL 100* 108 109  CO2 24 24 25   GLUCOSE 131* 105* 94  BUN 56* 34* 24*  CREATININE 5.55* 2.62* 2.14*  CALCIUM 9.5 8.9 9.1   LFT  Recent Labs  01/08/17 0257  PROT 7.3  ALBUMIN 4.1  AST 24  ALT 18  ALKPHOS 61  BILITOT 1.1   PT/INR  Recent Labs  01/08/17 0617  LABPROT 13.8  INR 1.06   Hepatitis Panel No results for input(s): HEPBSAG, HCVAB, HEPAIGM, HEPBIGM in the last 72 hours. C-Diff No results for input(s): CDIFFTOX in the last 72 hours. Fecal Lactopherrin No results for input(s): FECLLACTOFRN in the last 72 hours.  Studies/Results: US Renal  Result Date: 01/08/2017 CLINICAL DATA:  Acute renal failure. EXAM: RENAL / URINARY TRACT ULTRASOUND COMPLETE COMPARISON:  None. FINDINGS: Right Kidney: Length: 12.9 cm. Echogenicity within normal  limits. Moderate right-sided hydronephrosis is present. The proximal right ureter is dilated as well. No obstructing lesion is evident. Parenchymal echogenicity is within normal limits. Left Kidney: Length: 10.9 cm, within normal limits. Echogenicity within normal limits. No mass is visualized. A small amount of perinephric fluid is noted at the lower pole. Mild left-sided hydronephrosis is present. No obstructing lesion is evident. Bladder: A Foley catheter is present. IMPRESSION: 1. Bilateral hydronephrosis, right greater than left. 2. Mild perinephric fluid at the lower pole of the left kidney. 3. No obstructing lesions are visualized. 4. Foley catheter is present within the bladder. Electronically Signed   By: San Morelle M.D.   On: 01/08/2017 11:30    Medications:  Scheduled: . pantoprazole  40 mg Oral BID   Continuous: . sodium chloride 1 mL (01/09/17 1857)    Assessment/Plan: 1) LA Grade D esophagitis with arteriole bleeding s/p hemoclipping. 2) Anemia - stable.   His HGB is stable.  With the severity of his esophagitis and the bleeding, I recommend one more day of observation.  I will advance his diet to a clear liquid diet.  He needs to be on a PPI BID x 2 month and then QD indefinitely.  In the setting of LA Grade C and D esophagitis, a follow up EGD needs to be performed in 2 months to assess for any evidence of Barrett's esophagus.    Plan: 1) BID PPI x  2 months. 2) Repeat EGD in 2 months. 3) Clear liquid diet. 4)  GI to resume care in the AM.  LOS: 2 days   Aften Lipsey D 01/10/2017, 8:30 AM

## 2017-01-10 NOTE — Progress Notes (Signed)
PROGRESS NOTE    Todd Holden  VEH:[id number] DOB: 1956/05/24 DOA: 01/08/2017 PCP: Pcp Not In System    Brief Narrative:  61 y.o. male with medical history significant of HTN and BPH; who presents with complaints of 4-5 days of vomiting up black stuff. Patient notes that he's not been under the care of primary doctor in quite some time. He states that he's been vomiting black material at least 3 times per day.  Assessment & Plan:   Principal Problem:   GI bleed - Pt is s/p EGD - Had clip placed. Plan is to continue to monitor blood levels after procedure. GI managing diet - Pt on protonix  Active Problems:   Hematemesis - resolved    Acute blood loss anemia - Blood levels stable    Acute renal failure (ARF) (HCC) - Much improved.  - reassess next am.    BPH (benign prostatic hyperplasia) - stable    Leukocytosis - None   DVT prophylaxis: SCD's Code Status: Full Family Communication: None at bedside Disposition Plan: pending improvement in condition   Consultants:   GI   Procedures: EGD   Antimicrobials: None   Subjective: Pt has no new complaints. No acute issues overnight.  Objective: Vitals:   01/09/17 0835 01/09/17 1356 01/09/17 2040 01/10/17 0431  BP: (!) 154/77 139/78 (!) 166/78 (!) 160/86  Pulse: 98 85 86 78  Resp: 14 16 16 16   Temp:  98 F (36.7 C) 99.8 F (37.7 C) 97.7 F (36.5 C)  TempSrc:  Oral Oral Oral  SpO2: 100% 100% 99% 100%  Weight:      Height:        Intake/Output Summary (Last 24 hours) at 01/10/17 1443 Last data filed at 01/10/17 1300  Gross per 24 hour  Intake             2175 ml  Output             3075 ml  Net             -900 ml   Filed Weights   01/08/17 0056 01/09/17 0740  Weight: 86.2 kg (190 lb) 86.2 kg (190 lb)    Examination:  General exam: Appears calm and comfortable, in nad. Respiratory system: Clear to auscultation. Respiratory effort normal. Cardiovascular system: S1 & S2 heard, RRR. No  JVD, murmurs, rubs, gallops or clicks. No pedal edema. Gastrointestinal system: Abdomen is nondistended, soft and nontender.  Normal bowel sounds heard. Central nervous system: Alert and oriented. No focal neurological deficits. Extremities: Symmetric 5 x 5 power. Skin: No rashes, lesions or ulcers, on limited exam. Psychiatry: Judgement and insight appear normal. Mood & affect appropriate.    Data Reviewed: I have personally reviewed following labs and imaging studies  CBC:  Recent Labs Lab 01/08/17 0257 01/08/17 0617 01/08/17 1223 01/09/17 0554 01/10/17 0418  WBC 15.4*  --   --  12.2* 10.6*  NEUTROABS 13.0*  --   --   --   --   HGB 10.9* 10.9* 10.7* 10.7* 11.1*  HCT 31.1* 31.1* 30.8* 31.5* 32.2*  MCV 88.4  --   --  90.3 91.0  PLT 220  --   --  230 836   Basic Metabolic Panel:  Recent Labs Lab 01/08/17 0257 01/09/17 0554 01/10/17 0418  NA 139 142 144  K 3.8 3.3* 3.1*  CL 100* 108 109  CO2 24 24 25   GLUCOSE 131* 105* 94  BUN 56* 34* 24*  CREATININE  5.55* 2.62* 2.14*  CALCIUM 9.5 8.9 9.1   GFR: Estimated Creatinine Clearance: 41.5 mL/min (A) (by C-G formula based on SCr of 2.14 mg/dL (H)). Liver Function Tests:  Recent Labs Lab 01/08/17 0257  AST 24  ALT 18  ALKPHOS 61  BILITOT 1.1  PROT 7.3  ALBUMIN 4.1   No results for input(s): LIPASE, AMYLASE in the last 168 hours. No results for input(s): AMMONIA in the last 168 hours. Coagulation Profile:  Recent Labs Lab 01/08/17 0617  INR 1.06   Cardiac Enzymes: No results for input(s): CKTOTAL, CKMB, CKMBINDEX, TROPONINI in the last 168 hours. BNP (last 3 results) No results for input(s): PROBNP in the last 8760 hours. HbA1C: No results for input(s): HGBA1C in the last 72 hours. CBG: No results for input(s): GLUCAP in the last 168 hours. Lipid Profile: No results for input(s): CHOL, HDL, LDLCALC, TRIG, CHOLHDL, LDLDIRECT in the last 72 hours. Thyroid Function Tests: No results for input(s): TSH,  T4TOTAL, FREET4, T3FREE, THYROIDAB in the last 72 hours. Anemia Panel: No results for input(s): VITAMINB12, FOLATE, FERRITIN, TIBC, IRON, RETICCTPCT in the last 72 hours. Sepsis Labs: No results for input(s): PROCALCITON, LATICACIDVEN in the last 168 hours.  Recent Results (from the past 240 hour(s))  Urine culture     Status: None   Collection Time: 01/08/17  5:15 AM  Result Value Ref Range Status   Specimen Description URINE, CATHETERIZED  Final   Special Requests NONE  Final   Culture NO GROWTH  Final   Report Status 01/09/2017 FINAL  Final    Radiology Studies: No results found. Scheduled Meds: . pantoprazole  40 mg Oral BID   Continuous Infusions: . sodium chloride 75 mL/hr at 01/10/17 0834     LOS: 2 days    Time spent: > 35 minutes Velvet Bathe, MD [facility] Pager [phone]  If 7PM-7AM, please contact night-coverage [email or web address] Password [facility] 01/10/2017, 2:43 PM

## 2017-01-11 ENCOUNTER — Encounter (HOSPITAL_COMMUNITY): Payer: Self-pay | Admitting: Gastroenterology

## 2017-01-11 DIAGNOSIS — K920 Hematemesis: Secondary | ICD-10-CM

## 2017-01-11 DIAGNOSIS — D62 Acute posthemorrhagic anemia: Secondary | ICD-10-CM

## 2017-01-11 DIAGNOSIS — K209 Esophagitis, unspecified: Secondary | ICD-10-CM

## 2017-01-11 NOTE — Progress Notes (Signed)
PROGRESS NOTE    Todd Holden  VCB:[id number] DOB: 1956/09/14 DOA: 01/08/2017 PCP: Pcp Not In System    Brief Narrative:  61 y.o. male with medical history significant of HTN and BPH; who presents with complaints of 4-5 days of vomiting up black stuff. Patient notes that he's not been under the care of primary doctor in quite some time. He states that he's been vomiting black material at least 3 times per day.  Assessment & Plan:   Principal Problem:   GI bleed - Pt is s/p EGD - Had clip placed. Plan is to continue to monitor blood levels after procedure. GI managing diet - Pt on protonix - GI advancing diet and based on how he tolerates it then we will decide when to discharge. I discussed this with patient and he had some discomfort and would like to wait until next am. I think this is reasonable and will reassess cbc next am.  Active Problems:   Hematemesis - resolved    Acute blood loss anemia - Blood levels stable    Acute renal failure (ARF) (HCC) - Much improved.  - reassess next am.    BPH (benign prostatic hyperplasia) - stable    Leukocytosis - None  DVT prophylaxis: SCD's Code Status: Full Family Communication: None at bedside Disposition Plan: pending improvement in condition   Consultants:   GI   Procedures: EGD   Antimicrobials: None  Subjective: No new reported problems. Had some discomfort with foods but tolerable  Objective: Vitals:   01/10/17 1300 01/10/17 2102 01/11/17 0602 01/11/17 1440  BP: (!) 141/81 (!) 151/81 (!) 151/86 128/71  Pulse: (!) 101 79 75 85  Resp: 17 18 18 18   Temp: 97.8 F (36.6 C) 98.3 F (36.8 C) 98.3 F (36.8 C) 98.1 F (36.7 C)  TempSrc: Oral Oral Oral Oral  SpO2: 100% 100% 100% 100%  Weight:      Height:        Intake/Output Summary (Last 24 hours) at 01/11/17 1624 Last data filed at 01/11/17 0700  Gross per 24 hour  Intake             1275 ml  Output             3325 ml  Net            -2050 ml    Filed Weights   01/08/17 0056 01/09/17 0740  Weight: 86.2 kg (190 lb) 86.2 kg (190 lb)    Examination:  General exam: Appears calm and comfortable, in nad. Respiratory system: Clear to auscultation. Respiratory effort normal. Cardiovascular system: S1 & S2 heard, RRR. No JVD, murmurs, rubs, gallops or clicks. No pedal edema. Gastrointestinal system: Abdomen is nondistended, soft and nontender.  Normal bowel sounds heard. Central nervous system: Alert and oriented. No focal neurological deficits. Extremities: Symmetric 5 x 5 power. Skin: No rashes, lesions or ulcers, on limited exam. Psychiatry: Judgement and insight appear normal. Mood & affect appropriate.    Data Reviewed: I have personally reviewed following labs and imaging studies  CBC:  Recent Labs Lab 01/08/17 0257 01/08/17 0617 01/08/17 1223 01/09/17 0554 01/10/17 0418  WBC 15.4*  --   --  12.2* 10.6*  NEUTROABS 13.0*  --   --   --   --   HGB 10.9* 10.9* 10.7* 10.7* 11.1*  HCT 31.1* 31.1* 30.8* 31.5* 32.2*  MCV 88.4  --   --  90.3 91.0  PLT 220  --   --  230 977   Basic Metabolic Panel:  Recent Labs Lab 01/08/17 0257 01/09/17 0554 01/10/17 0418  NA 139 142 144  K 3.8 3.3* 3.1*  CL 100* 108 109  CO2 24 24 25   GLUCOSE 131* 105* 94  BUN 56* 34* 24*  CREATININE 5.55* 2.62* 2.14*  CALCIUM 9.5 8.9 9.1   GFR: Estimated Creatinine Clearance: 41.5 mL/min (A) (by C-G formula based on SCr of 2.14 mg/dL (H)). Liver Function Tests:  Recent Labs Lab 01/08/17 0257  AST 24  ALT 18  ALKPHOS 61  BILITOT 1.1  PROT 7.3  ALBUMIN 4.1   No results for input(s): LIPASE, AMYLASE in the last 168 hours. No results for input(s): AMMONIA in the last 168 hours. Coagulation Profile:  Recent Labs Lab 01/08/17 0617  INR 1.06   Cardiac Enzymes: No results for input(s): CKTOTAL, CKMB, CKMBINDEX, TROPONINI in the last 168 hours. BNP (last 3 results) No results for input(s): PROBNP in the last 8760  hours. HbA1C: No results for input(s): HGBA1C in the last 72 hours. CBG: No results for input(s): GLUCAP in the last 168 hours. Lipid Profile: No results for input(s): CHOL, HDL, LDLCALC, TRIG, CHOLHDL, LDLDIRECT in the last 72 hours. Thyroid Function Tests: No results for input(s): TSH, T4TOTAL, FREET4, T3FREE, THYROIDAB in the last 72 hours. Anemia Panel: No results for input(s): VITAMINB12, FOLATE, FERRITIN, TIBC, IRON, RETICCTPCT in the last 72 hours. Sepsis Labs: No results for input(s): PROCALCITON, LATICACIDVEN in the last 168 hours.  Recent Results (from the past 240 hour(s))  Urine culture     Status: None   Collection Time: 01/08/17  5:15 AM  Result Value Ref Range Status   Specimen Description URINE, CATHETERIZED  Final   Special Requests NONE  Final   Culture NO GROWTH  Final   Report Status 01/09/2017 FINAL  Final    Radiology Studies: No results found. Scheduled Meds: . pantoprazole  40 mg Oral BID   Continuous Infusions:    LOS: 3 days    Time spent: > 35 minutes Velvet Bathe, MD [facility] Pager [phone]  If 7PM-7AM, please contact night-coverage [email or web address] Password [facility] 01/11/2017, 4:24 PM

## 2017-01-11 NOTE — Progress Notes (Signed)
          Daily Rounding Note  01/11/2017, 12:32 PM  LOS: 3 days   SUBJECTIVE:   Chief complaint: nio further n/v.  n abd pain.  No BM.  Remains on clear diet, hungry    OBJECTIVE:         Vital signs in last 24 hours:    Temp:  [97.8 F (36.6 C)-98.3 F (36.8 C)] 98.3 F (36.8 C) (04/30 0602) Pulse Rate:  [75-101] 75 (04/30 0602) Resp:  [17-18] 18 (04/30 0602) BP: (141-151)/(81-86) 151/86 (04/30 0602) SpO2:  [100 %] 100 % (04/30 0602) Last BM Date: 01/07/17 Filed Weights   01/08/17 0056 01/09/17 0740  Weight: 86.2 kg (190 lb) 86.2 kg (190 lb)   General: pleasant, looks well and comfortable   Heart: RRR Chest: clear bil.  No dyspnea, no cough Abdomen: soft, NT.  ND.  Active BS  Extremities: No CCE Neuro/Psych:  Oriented x 3.  No weakness.  Calm, in good spirits.   Intake/Output from previous day: 04/29 0701 - 04/30 0700 In: 2203.8 [P.O.:240; I.V.:1963.8] Out: 4325 [[id number]]  Intake/Output this shift: No intake/output data recorded.  Lab Results:  Recent Labs  01/09/17 0554 01/10/17 0418  WBC 12.2* 10.6*  HGB 10.7* 11.1*  HCT 31.5* 32.2*  PLT 230 255   BMET  Recent Labs  01/09/17 0554 01/10/17 0418  NA 142 144  K 3.3* 3.1*  CL 108 109  CO2 24 25  GLUCOSE 105* 94  BUN 34* 24*  CREATININE 2.62* 2.14*  CALCIUM 8.9 9.1   LFT No results for input(s): PROT, ALBUMIN, AST, ALT, ALKPHOS, BILITOT, BILIDIR, IBILI in the last 72 hours. PT/INR No results for input(s): LABPROT, INR in the last 72 hours. Hepatitis Panel No results for input(s): HEPBSAG, HCVAB, HEPAIGM, HEPBIGM in the last 72 hours.  Studies/Results: No results found.   Scheduled Meds: . pantoprazole  40 mg Oral BID   Continuous Infusions: . sodium chloride 75 mL/hr at 01/10/17 2138   PRN Meds:.albuterol, ondansetron **OR** ondansetron (ZOFRAN) IV   ASSESMENT:   *  Hematemesis.    EGD 01/09/17:  LA grade D esophagitis with  clipping to arterial bleeding , 3 cm HH.  IV PPI converted to oral, BID   *  AKI.  ? Underlying CKD (never goes to MD so not comp labs).  Labs improved.  Transferred from [place] to [facility] for Nephrology but has not been seen by this specialty.     PLAN   *  Oral, BID PPI for at least 2 months.  Will need repeat surveillance EGD in 2 months to assess for possible underlying Barretts esophagus.    *  If tolerates regular diet, from GI perspective ok to discharge home    *  GI ROV with Dr Carlean Purl 6/19 at 1:45 PM.  Will also need to keep the appt with [facility] clinic in [place] he had made, 01/19/17.    Pt understands importance of complying with BID PPI (generic omeprazole is OK) and need for future EGD.      Todd Holden  01/11/2017, 12:32 PM Pager: [phone]

## 2017-01-11 NOTE — Care Management Note (Signed)
Case Management Note  Patient Details  Name: Demian Maisel MRN: [id number] Date of Birth: 09/22/1955  Subjective/Objective:                    Action/Plan:   Expected Discharge Date:                  Expected Discharge Plan:  Home/Self Care  In-House Referral:  NA  Discharge planning Services  CM Consult, [place], [facility]  Post Acute Care Choice:  NA Choice offered to:  NA  DME Arranged:  N/A DME Agency:  NA  HH Arranged:  NA HH Agency:  NA  Status of Service:  Completed, signed off  If discussed at [place] of Stay Meetings, dates discussed:    Additional Comments:  Marilu Favre, RN 01/11/2017, 3:48 PM

## 2017-01-12 LAB — CBC
HCT: 30.3 % — ABNORMAL LOW (ref 39.0–52.0)
Hemoglobin: 10.4 g/dL — ABNORMAL LOW (ref 13.0–17.0)
MCH: 30.7 pg (ref 26.0–34.0)
MCHC: 34.3 g/dL (ref 30.0–36.0)
MCV: 89.4 fL (ref 78.0–100.0)
Platelets: 287 10*3/uL (ref 150–400)
RBC: 3.39 MIL/uL — ABNORMAL LOW (ref 4.22–5.81)
RDW: 12.3 % (ref 11.5–15.5)
WBC: 8.4 10*3/uL (ref 4.0–10.5)

## 2017-01-12 MED ORDER — PANTOPRAZOLE SODIUM 40 MG PO TBEC: 40.0000 mg | DELAYED_RELEASE_TABLET | Freq: Two times a day (BID) | ORAL | 0 refills | Status: DC

## 2017-01-12 NOTE — Discharge Summary (Signed)
Physician Discharge Summary  Todd Holden TIW:[id number] DOB: [birth year] DOA: 01/08/2017  PCP: Pcp Not In System  Admit date: 01/08/2017 Discharge date: 01/12/2017  Time spent: > 35 minutes  Recommendations for Outpatient Follow-up:  1. Please ensure compliance with PPI and ensure f/u with GI as listed below.   Discharge Diagnoses:  Principal Problem:   GI bleed Active Problems:   Hematemesis   Acute blood loss anemia   Acute renal failure (ARF) (HCC)   BPH (benign prostatic hyperplasia)   Leukocytosis   Discharge Condition: stable  Diet recommendation: regular diet  Filed Weights   01/08/17 0056 01/09/17 0740  Weight: 86.2 kg (190 lb) 86.2 kg (190 lb)    History of present illness:  60 y.o.malewith medical history significant of HTN and BPH; who presents with complaints of 4-5 days of vomiting up black stuff.   Hospital Course:  Hematemesis - Gi consulted and : EGD 01/09/17:  LA grade D esophagitis with clipping to arterial bleeding , 3 cm HH.  IV PPI converted to oral, BID   Per GI: BID PPI x at least 2 months.  Surveillance EGD recommended in 8-12 weeks with Dr. Carlean Purl   Procedures:  As listed above  Consultations:  GI  Discharge Exam: Vitals:   01/11/17 2108 01/12/17 0504  BP: (!) 144/73 (!) 144/84  Pulse: 80 62  Resp: 18 19  Temp: 98.8 F (37.1 C) 98.6 F (37 C)    General: Pt in nad, alert and awake Cardiovascular: rrr, no rubs Respiratory: no increased wob, no wheezes  Discharge Instructions   Discharge Instructions    Call MD for:  extreme fatigue    Complete by:  As directed    Call MD for:  persistant nausea and vomiting    Complete by:  As directed    Call MD for:  redness, tenderness, or signs of infection (pain, swelling, redness, odor or green/yellow discharge around incision site)    Complete by:  As directed    Call MD for:  temperature >100.4    Complete by:  As directed    Diet - low sodium heart healthy    Complete  by:  As directed    Discharge instructions    Complete by:  As directed    BID PPI x at least 2 months.  Surveillance EGD recommended in 8-12 weeks with Dr. Carlean Purl   Increase activity slowly    Complete by:  As directed      Current Discharge Medication List    START taking these medications   Details  pantoprazole (PROTONIX) 40 MG tablet Take 1 tablet (40 mg total) by mouth 2 (two) times daily. Qty: 60 tablet, Refills: 0      STOP taking these medications     ibuprofen (ADVIL,MOTRIN) 200 MG tablet        No Known Allergies Follow-up [facility] in [place] Follow up.   Why:  has appointment on 01/19/2017 at 8:30 am Contact information: [place]       Silvano Rusk, MD Follow up on 03/02/2017.   Specialty:  Gastroenterology Why:  1:45 PM with stomach, digestive disease MD.   Contact information: [address] [place] [address] [phone]            The results of significant diagnostics from this hospitalization (including imaging, microbiology, ancillary and laboratory) are listed below for reference.    Significant Diagnostic Studies:  US Renal  Result Date: 01/08/2017 CLINICAL DATA:  Acute renal failure. EXAM: RENAL / URINARY TRACT ULTRASOUND COMPLETE COMPARISON:  None. FINDINGS: Right Kidney: Length: 12.9 cm. Echogenicity within normal limits. Moderate right-sided hydronephrosis is present. The proximal right ureter is dilated as well. No obstructing lesion is evident. Parenchymal echogenicity is within normal limits. Left Kidney: Length: 10.9 cm, within normal limits. Echogenicity within normal limits. No mass is visualized. A small amount of perinephric fluid is noted at the lower pole. Mild left-sided hydronephrosis is present. No obstructing lesion is evident. Bladder: A Foley catheter is present. IMPRESSION: 1. Bilateral hydronephrosis, right greater than left. 2. Mild perinephric fluid at the  lower pole of the left kidney. 3. No obstructing lesions are visualized. 4. Foley catheter is present within the bladder. Electronically Signed   By: San Morelle M.D.   On: 01/08/2017 11:30    Microbiology: Recent Results (from the past 240 hour(s))  Urine culture     Status: None   Collection Time: 01/08/17  5:15 AM  Result Value Ref Range Status   Specimen Description URINE, CATHETERIZED  Final   Special Requests NONE  Final   Culture NO GROWTH  Final   Report Status 01/09/2017 FINAL  Final     Labs: Basic Metabolic Panel:  Recent Labs Lab 01/08/17 0257 01/09/17 0554 01/10/17 0418  NA 139 142 144  K 3.8 3.3* 3.1*  CL 100* 108 109  CO2 24 24 25   GLUCOSE 131* 105* 94  BUN 56* 34* 24*  CREATININE 5.55* 2.62* 2.14*  CALCIUM 9.5 8.9 9.1   Liver Function Tests:  Recent Labs Lab 01/08/17 0257  AST 24  ALT 18  ALKPHOS 61  BILITOT 1.1  PROT 7.3  ALBUMIN 4.1   No results for input(s): LIPASE, AMYLASE in the last 168 hours. No results for input(s): AMMONIA in the last 168 hours. CBC:  Recent Labs Lab 01/08/17 0257 01/08/17 0617 01/08/17 1223 01/09/17 0554 01/10/17 0418 01/12/17 0331  WBC 15.4*  --   --  12.2* 10.6* 8.4  NEUTROABS 13.0*  --   --   --   --   --   HGB 10.9* 10.9* 10.7* 10.7* 11.1* 10.4*  HCT 31.1* 31.1* 30.8* 31.5* 32.2* 30.3*  MCV 88.4  --   --  90.3 91.0 89.4  PLT 220  --   --  230 255 287   Cardiac Enzymes: No results for input(s): CKTOTAL, CKMB, CKMBINDEX, TROPONINI in the last 168 hours. BNP: BNP (last 3 results) No results for input(s): BNP in the last 8760 hours.  ProBNP (last 3 results) No results for input(s): PROBNP in the last 8760 hours.  CBG: No results for input(s): GLUCAP in the last 168 hours.  Signed:  Velvet Bathe MD.  [facility] 01/12/2017, 1:53 PM

## 2017-01-12 NOTE — Progress Notes (Signed)
At 1620 pt still unable to void.  Bladder scan showed 746cc in bladder.  MD notified and order received for an I&O cath.  I&O cath obtained 750cc.  MD stated to hold discharge until pt able to void.  Will continue to monitor.  Eliezer [place]

## 2017-01-13 ENCOUNTER — Inpatient Hospital Stay (HOSPITAL_COMMUNITY): Payer: Medicaid Other

## 2017-01-13 DIAGNOSIS — D72829 Elevated white blood cell count, unspecified: Secondary | ICD-10-CM

## 2017-01-13 DIAGNOSIS — N179 Acute kidney failure, unspecified: Secondary | ICD-10-CM

## 2017-01-13 DIAGNOSIS — N1330 Unspecified hydronephrosis: Secondary | ICD-10-CM | POA: Diagnosis present

## 2017-01-13 DIAGNOSIS — R338 Other retention of urine: Secondary | ICD-10-CM

## 2017-01-13 DIAGNOSIS — N401 Enlarged prostate with lower urinary tract symptoms: Secondary | ICD-10-CM

## 2017-01-13 DIAGNOSIS — E876 Hypokalemia: Secondary | ICD-10-CM

## 2017-01-13 LAB — URINALYSIS, ROUTINE W REFLEX MICROSCOPIC
BILIRUBIN URINE: NEGATIVE
Glucose, UA: NEGATIVE mg/dL
KETONES UR: NEGATIVE mg/dL
LEUKOCYTES UA: NEGATIVE
NITRITE: NEGATIVE
Protein, ur: 30 mg/dL — AB
Specific Gravity, Urine: 1.006 (ref 1.005–1.030)
Squamous Epithelial / LPF: NONE SEEN
pH: 6 (ref 5.0–8.0)

## 2017-01-13 LAB — CBC WITH DIFFERENTIAL/PLATELET
BASOS ABS: 0.1 10*3/uL (ref 0.0–0.1)
Basophils Relative: 1 %
Eosinophils Absolute: 0.4 10*3/uL (ref 0.0–0.7)
Eosinophils Relative: 4 %
HEMATOCRIT: 31.3 % — AB (ref 39.0–52.0)
HEMOGLOBIN: 10.9 g/dL — AB (ref 13.0–17.0)
LYMPHS PCT: 15 %
Lymphs Abs: 1.6 10*3/uL (ref 0.7–4.0)
MCH: 30.9 pg (ref 26.0–34.0)
MCHC: 34.8 g/dL (ref 30.0–36.0)
MCV: 88.7 fL (ref 78.0–100.0)
Monocytes Absolute: 0.6 10*3/uL (ref 0.1–1.0)
Monocytes Relative: 5 %
NEUTROS ABS: 8 10*3/uL — AB (ref 1.7–7.7)
Neutrophils Relative %: 75 %
Platelets: 286 10*3/uL (ref 150–400)
RBC: 3.53 MIL/uL — AB (ref 4.22–5.81)
RDW: 12.4 % (ref 11.5–15.5)
WBC: 10.6 10*3/uL — AB (ref 4.0–10.5)

## 2017-01-13 LAB — BASIC METABOLIC PANEL
Anion gap: 11 (ref 5–15)
BUN: 19 mg/dL (ref 6–20)
CO2: 25 mmol/L (ref 22–32)
Calcium: 9.1 mg/dL (ref 8.9–10.3)
Chloride: 104 mmol/L (ref 101–111)
Creatinine, Ser: 1.91 mg/dL — ABNORMAL HIGH (ref 0.61–1.24)
GFR calc Af Amer: 42 mL/min — ABNORMAL LOW (ref >60)
GFR, EST NON AFRICAN AMERICAN: 37 mL/min — AB (ref >60)
GLUCOSE: 161 mg/dL — AB (ref 65–99)
POTASSIUM: 3 mmol/L — AB (ref 3.5–5.1)
Sodium: 140 mmol/L (ref 135–145)

## 2017-01-13 LAB — MAGNESIUM: Magnesium: 1.6 mg/dL — ABNORMAL LOW (ref 1.7–2.4)

## 2017-01-13 MED ORDER — MAGNESIUM SULFATE 4 GM/100ML IV SOLN
4.0000 g | Freq: Once | INTRAVENOUS | Status: AC
  Administered 2017-01-13: 4 g via INTRAVENOUS
  Filled 2017-01-13 (×2): qty 100

## 2017-01-13 MED ORDER — TAMSULOSIN HCL 0.4 MG PO CAPS: 0.4000 mg | ORAL_CAPSULE | Freq: Every day | ORAL | 11 refills | Status: DC

## 2017-01-13 MED ORDER — POTASSIUM CHLORIDE CRYS ER 20 MEQ PO TBCR
40.0000 meq | EXTENDED_RELEASE_TABLET | ORAL | Status: AC
  Administered 2017-01-13 (×2): 40 meq via ORAL
  Filled 2017-01-13 (×2): qty 2

## 2017-01-13 NOTE — Progress Notes (Signed)
Paged floor coverage TRH since patient still unable to void and felt bladder full.   Bladder scan showed a total of 1300 cc in different areas of bladder quadrant. Awaiting for further order.

## 2017-01-13 NOTE — Consult Note (Addendum)
Urology Consult  Consulting MD: Irine Seal, M.D.  CC: Bladder abnormalities, hydronephrosis, urinary retention  HPI: This is a 61 year old male who presented to the hospital recently because of upper GI distress as well as coffee-ground emesis.  On evaluation, the patient was found to have renal insufficiency.  Serum creatinine was 5.55.  Potassium 3.8.  Hemoglobin was 10.9.  Subsequent renal ultrasound revealed bilateral hydronephrosis.  At that point, a catheter had been placed in the bladder.  The patient has had slow resolution of his renal failure, with his most recent creatinine 1.91.  CT scan was performed today, performed without contrast.  This revealed fairly normal appearing renal parenchyma, with hydroureter all the way down to a thick-walled bladder.  Catheter was in place.  In questioning, the patient has had long-term (in years), lower urinary tract dysfunction.  This includes frequency, urgency, intermittency, hesitancy, low volume voids, and feeling of incomplete emptying.  He denies any recent history of urinary tract infections, and had no blood in his urine until the catheter was placed recently.  There is no family history of prostate cancer, but his brother apparently had urinary retention and BPH with subsequent renal insufficiency.  He apparently had surgical management of his BPH.  PMH: Past Medical History:  Diagnosis Date  . Hypertension   . Prostate enlargement 2008    PSH: Past Surgical History:  Procedure Laterality Date  . ESOPHAGOGASTRODUODENOSCOPY N/A 01/09/2017   Procedure: ESOPHAGOGASTRODUODENOSCOPY (EGD);  Surgeon: Carol Ada, MD;  [facility] ENDOSCOPY;  Service: Endoscopy;  Laterality: N/A;  . TONSILLECTOMY     61 years old    Allergies: No Known Allergies  Medications: Prescriptions Prior to Admission  Medication Sig Dispense Refill Last Dose  . ibuprofen (ADVIL,MOTRIN) 200 MG tablet Take 200-400 mg by mouth every 6 (six) hours as needed  for headache or moderate pain.   Past Week at Unknown time     Social History: Social History   Social History  . Marital status: Divorced    Spouse name: N/A  . Number of children: N/A  . Years of education: N/A   Occupational History  . Not on file.   Social History Main Topics  . Smoking status: Former Smoker    Packs/day: 1.00    Types: Cigarettes    Quit date: 6  . Smokeless tobacco: Never Used  . Alcohol use No  . Drug use: Yes    Types: Marijuana     Comment: quit 06/2016  . Sexual activity: Not on file   Other Topics Concern  . Not on file   Social History Narrative  . No narrative on file    Family History: Family History  Problem Relation Age of Onset  . Diabetes Father   . Parkinson's disease Father     Review of Systems: Positive: Above-mentioned lower urinary tract symptoms. Negative:  He has had no weight loss, bony pain.  Appetite has been fairly normal.  A further 10 point review of systems was negative except what is listed in the HPI.  Physical Exam: @VITALS2 @ General: No acute distress.  Awake. Head:  Normocephalic.  Atraumatic. ENT:  EOMI.  Mucous membranes moist Neck:  Supple.  No lymphadenopathy. CV:  S1 present. S2 present. Regular rate. Pulmonary: Equal effort bilaterally.  Clear to auscultation bilaterally. Abdomen: Soft.  Non- tender to palpation. Skin:  Normal turgor.  No visible rash. Extremity: No gross deformity of bilateral upper extremities.  No gross deformity of  bilateral lower extremities. Neurologic: Alert. Appropriate mood. Rectal:            Normal anal sphincter tone.  Gland was significantly enlarged, and stony hard/nodular.  Very suspicious for prostate cancer. Penis:  Circumcised. Urethra: Foley catheter in place.  Orthotopic meatus. Scrotum: No lesions.  No ecchymosis.  No erythema. Testicles: Descended bilaterally.  No masses bilaterally. Epididymis: Palpable bilaterally.  Non Tender to  palpation.  Studies:  Recent Labs     01/12/17  0331  01/13/17  0923  HGB  10.4*  10.9*  WBC  8.4  10.6*  PLT  287  286    Recent Labs     01/13/17  0923  NA  140  K  3.0*  CL  104  CO2  25  BUN  19  CREATININE  1.91*  CALCIUM  9.1  GFRNONAA  46*  GFRAA  42*    I personally reviewed the patient's laboratory studies as well as a CT scan.  He has a significantly thickened bladder wall consistent with long term bladder outlet obstruction/hypertrophy.  He does have mild residual hydronephrosis, but this is most likely a long-standing issue and I doubt, is going to return to normal quickly.  No results for input(s): INR, APTT in the last 72 hours.  Invalid input(s): PT   Invalid input(s): ABG    Assessment:  1.  Urinary retention from bladder outlet obstruction, with subsequent hydronephrosis bilaterally as well as acute renal insufficiency.  This is slowly resolving.  2.  Exceedingly abnormal prostate exam, strongly suspicious for prostate cancer.  This, with a couple of abnormal lymph nodes in the pelvis makes me feel the patient does have significant disease  Plan: 1.  For now, the patient needs to have his catheter left in.  We can start him on alpha blockers and advance a possible voiding trial in the near future in the office.  2.  I have ordered a PSA to be drawn tomorrow.  Although he has had a recent catheter, this will give [place] a clear view of him having a significant issue with prostate cancer.  3.  I will follow the patient up in the office.  He will need an ultrasound and biopsy at some point.  We will get this arranged.  4.  I shared my suspicions with the patient, who seemed to take this fairly well.  5.  I'm fine with letting the patient go home at your leisure.  I don't think that his CT is that worrisome.  At this point, i.e. residual hydronephrosis.  If his renal function continues to improve, he should be able to go home.    [phone]

## 2017-01-13 NOTE — Progress Notes (Signed)
PROGRESS NOTE    Todd Holden  UUV:[id number] DOB: [birth year] DOA: 01/08/2017 PCP: Pcp Not In System    Brief Narrative:  Patient is 61 year old gentleman history of hypertension, BPH presented with complaints of 4-5 days of coffee-ground emesis. Patient underwent upper endoscopy and noted to have LA grade B esophagitis status post clipping. Patient also noted to have significant urinary retention. On admission patient noted to be in acute renal failure with a creatinine of 5.5 which have improved since admission. Renal ultrasound done concerning for bilateral hydronephrosis. GI urology consulted.   Assessment & Plan:   Principal Problem:   GI bleed Active Problems:   Acute renal failure (ARF) (HCC)   Acute urinary retention   Hematemesis   Acute blood loss anemia   BPH (benign prostatic hyperplasia)   Leukocytosis   Hypokalemia   Hypomagnesemia   Hydronephrosis  #1 hematemesis/upper GI bleed secondary to LA grade D esophagitis with clipping to arterial bleeding Patient presented with hematemesis and underwent upper endoscopy on 01/09/2017 which showed LA grade D esophagitis status post clipping to arterial bleeding. Patient was on Protonix IV subsequently transitioned to oral Protonix twice a day. Patient with no further GI bleed. Hemoglobin has remained stable. Patient tolerating current diet. Per GI patient is to be on a PPI twice a day for at least 2 months. Patient will also likely need repeat surveillance EGD in 2 months to assess for possible underlying Barrett's esophagus. Follow.  #2 acute urinary retention Questionable etiology. Patient with history of BPH. Foley catheter placed with good urine output and symptomatic relief. Continue Flomax. Urology consultation.  #3 bilateral hydronephrosis right greater than left Noted on renal ultrasound from 01/08/2017. Patient had been undergoing iron however patient still with urinary retention. Foley catheter has been placed.  Patient started on Flomax. Patient's renal function has trended down since admission. We'll get a CT abdomen and pelvis without contrast stone protocol for further evaluation. Consult with urology for further evaluation and management. Follow.  #4 hypokalemia/hypomagnesemia Replete.  #5 acute renal failure Unknown baseline. Likely post renal azotemia as patient noted to have bilateral hydronephrosis right greater than left on renal ultrasound done on 01/08/2017 as well as urinary retention. Foley catheter placed with good urine output. Patient did have intermittent iron DURING the hospitalization. Renal function has trended down and creatinine currently at 1.91 from 5.55 on admission. Avoid nephrotoxins. Continue to follow renal function. Urology consultation pending.  #6 acute blood loss anemia Secondary to problem #1. H&H stable. Follow.  #7 leukocytosis Patient with no signs or symptoms of infection. Urinalysis is nitrite negative leukocytes negative. Patient with no respiratory symptoms. No need for antibiotics. Will follow for now.  #8 BPH Continue Flomax.   DVT prophylaxis: SCDs Code Status: Full Family Communication: Updated patient of family at bedside. Disposition Plan: Home once medically stable, acute renal failure improves, further evaluation by urology for hydronephrosis and urinary retention.   Consultants:   GI: Dr Carlean Purl 01/08/2017  Urology pending  Procedures:   Renal ultrasound 01/08/2017  CT abdomen and pelvis stone protocol pending 01/13/2017  Upper endoscopy 01/09/2017 Per Dr Benson [place]  Antimicrobials:   None   Subjective: Patient noted to have urinary retention overnight and had to have high done. Patient still with urinary retention and difficulty voiding despite being started on Flomax. Patient denies any chest pain. No shortness of breath. Patient with some abdominal lower discomfort. Patient denies any further hematemesis, No melanotic stool. Patient  states feeling better after Foley  catheter was placed.  Objective: Vitals:   01/12/17 0504 01/12/17 1451 01/12/17 2102 01/13/17 0519  BP: (!) 144/84 (!) 144/77 132/80 113/72  Pulse: 62 85 82 88  Resp: 19  18 19   Temp: 98.6 F (37 C) 98 F (36.7 C) 98.1 F (36.7 C) 98.1 F (36.7 C)  TempSrc: Oral Oral Oral Oral  SpO2: 100% 100% 100% 100%  Weight:      Height:        Intake/Output Summary (Last 24 hours) at 01/13/17 1218 Last data filed at 01/13/17 1132  Gross per 24 hour  Intake              480 ml  Output             2956 ml  Net            -2476 ml   Filed Weights   01/08/17 0056 01/09/17 0740  Weight: 86.2 kg (190 lb) 86.2 kg (190 lb)    Examination:  General exam: Appears calm and comfortable  Respiratory system: Clear to auscultation. Respiratory effort normal. Cardiovascular system: S1 & S2 heard, RRR. No JVD, murmurs, rubs, gallops or clicks. No pedal edema. Gastrointestinal system: Abdomen is nondistended, soft and nontender. No organomegaly or masses felt. Normal bowel sounds heard. Central nervous system: Alert and oriented. No focal neurological deficits. Extremities: Symmetric 5 x 5 power. Skin: No rashes, lesions or ulcers Psychiatry: Judgement and insight appear normal. Mood & affect appropriate.     Data Reviewed: I have personally reviewed following labs and imaging studies  CBC:  Recent Labs Lab 01/08/17 0257  01/08/17 1223 01/09/17 0554 01/10/17 0418 01/12/17 0331 01/13/17 0923  WBC 15.4*  --   --  12.2* 10.6* 8.4 10.6*  NEUTROABS 13.0*  --   --   --   --   --  8.0*  HGB 10.9*  < > 10.7* 10.7* 11.1* 10.4* 10.9*  HCT 31.1*  < > 30.8* 31.5* 32.2* 30.3* 31.3*  MCV 88.4  --   --  90.3 91.0 89.4 88.7  PLT 220  --   --  230 255 287 286  < > = values in this interval not displayed. Basic Metabolic Panel:  Recent Labs Lab 01/08/17 0257 01/09/17 0554 01/10/17 0418 01/13/17 0923  NA 139 142 144 140  K 3.8 3.3* 3.1* 3.0*  CL 100* 108 109  104  CO2 24 24 25 25   GLUCOSE 131* 105* 94 161*  BUN 56* 34* 24* 19  CREATININE 5.55* 2.62* 2.14* 1.91*  CALCIUM 9.5 8.9 9.1 9.1  MG  --   --   --  1.6*   GFR: Estimated Creatinine Clearance: 46.5 mL/min (A) (by C-G formula based on SCr of 1.91 mg/dL (H)). Liver Function Tests:  Recent Labs Lab 01/08/17 0257  AST 24  ALT 18  ALKPHOS 61  BILITOT 1.1  PROT 7.3  ALBUMIN 4.1   No results for input(s): LIPASE, AMYLASE in the last 168 hours. No results for input(s): AMMONIA in the last 168 hours. Coagulation Profile:  Recent Labs Lab 01/08/17 0617  INR 1.06   Cardiac Enzymes: No results for input(s): CKTOTAL, CKMB, CKMBINDEX, TROPONINI in the last 168 hours. BNP (last 3 results) No results for input(s): PROBNP in the last 8760 hours. HbA1C: No results for input(s): HGBA1C in the last 72 hours. CBG: No results for input(s): GLUCAP in the last 168 hours. Lipid Profile: No results for input(s): CHOL, HDL, LDLCALC, TRIG, CHOLHDL, LDLDIRECT in  the last 72 hours. Thyroid Function Tests: No results for input(s): TSH, T4TOTAL, FREET4, T3FREE, THYROIDAB in the last 72 hours. Anemia Panel: No results for input(s): VITAMINB12, FOLATE, FERRITIN, TIBC, IRON, RETICCTPCT in the last 72 hours. Sepsis Labs: No results for input(s): PROCALCITON, LATICACIDVEN in the last 168 hours.  Recent Results (from the past 240 hour(s))  Urine culture     Status: None   Collection Time: 01/08/17  5:15 AM  Result Value Ref Range Status   Specimen Description URINE, CATHETERIZED  Final   Special Requests NONE  Final   Culture NO GROWTH  Final   Report Status 01/09/2017 FINAL  Final         Radiology Studies: No results found.      Scheduled Meds: . pantoprazole  40 mg Oral BID  . potassium chloride  40 mEq Oral Q4H   Continuous Infusions: . magnesium sulfate 1 - 4 g bolus IVPB       LOS: 5 days    Time spent: 40 minutes    Rozanna Cormany, MD [facility] Pager  [phone]  If 7PM-7AM, please contact night-coverage [email or web address] Password [email or web address] 01/13/2017, 12:18 PM

## 2017-01-13 NOTE — Progress Notes (Addendum)
Received order for In and Out cath.  Did In and out cath per order and yeilded 1200 cc.  Post void In & out cath showed 56 cc.  Patient tolerated well the procedure and felt quite relieved.  Will continue to monitor.

## 2017-01-14 DIAGNOSIS — R3989 Other symptoms and signs involving the genitourinary system: Secondary | ICD-10-CM

## 2017-01-14 LAB — CBC WITH DIFFERENTIAL/PLATELET
BASOS ABS: 0 10*3/uL (ref 0.0–0.1)
BASOS PCT: 0 %
EOS PCT: 7 %
Eosinophils Absolute: 0.7 10*3/uL (ref 0.0–0.7)
HCT: 29.9 % — ABNORMAL LOW (ref 39.0–52.0)
Hemoglobin: 10.1 g/dL — ABNORMAL LOW (ref 13.0–17.0)
LYMPHS PCT: 29 %
Lymphs Abs: 2.7 10*3/uL (ref 0.7–4.0)
MCH: 30.4 pg (ref 26.0–34.0)
MCHC: 33.8 g/dL (ref 30.0–36.0)
MCV: 90.1 fL (ref 78.0–100.0)
MONO ABS: 0.7 10*3/uL (ref 0.1–1.0)
Monocytes Relative: 8 %
Neutro Abs: 5.2 10*3/uL (ref 1.7–7.7)
Neutrophils Relative %: 56 %
PLATELETS: 260 10*3/uL (ref 150–400)
RBC: 3.32 MIL/uL — ABNORMAL LOW (ref 4.22–5.81)
RDW: 12.5 % (ref 11.5–15.5)
WBC: 9.3 10*3/uL (ref 4.0–10.5)

## 2017-01-14 LAB — RENAL FUNCTION PANEL
ALBUMIN: 3.1 g/dL — AB (ref 3.5–5.0)
Anion gap: 7 (ref 5–15)
BUN: 16 mg/dL (ref 6–20)
CALCIUM: 8.7 mg/dL — AB (ref 8.9–10.3)
CO2: 28 mmol/L (ref 22–32)
Chloride: 104 mmol/L (ref 101–111)
Creatinine, Ser: 1.92 mg/dL — ABNORMAL HIGH (ref 0.61–1.24)
GFR calc Af Amer: 42 mL/min — ABNORMAL LOW (ref >60)
GFR calc non Af Amer: 36 mL/min — ABNORMAL LOW (ref >60)
GLUCOSE: 111 mg/dL — AB (ref 65–99)
PHOSPHORUS: 3 mg/dL (ref 2.5–4.6)
POTASSIUM: 3.7 mmol/L (ref 3.5–5.1)
Sodium: 139 mmol/L (ref 135–145)

## 2017-01-14 LAB — URINE CULTURE: Culture: NO GROWTH

## 2017-01-14 LAB — PSA: PSA: 75.65 ng/mL — ABNORMAL HIGH (ref 0.00–4.00)

## 2017-01-14 LAB — MAGNESIUM: MAGNESIUM: 2.3 mg/dL (ref 1.7–2.4)

## 2017-01-14 MED ORDER — PANTOPRAZOLE SODIUM 40 MG PO TBEC: 40.0000 mg | DELAYED_RELEASE_TABLET | Freq: Two times a day (BID) | ORAL | 2 refills | Status: DC

## 2017-01-14 MED ORDER — TAMSULOSIN HCL 0.4 MG PO CAPS: 0.4000 mg | ORAL_CAPSULE | Freq: Every day | ORAL | 2 refills | Status: DC

## 2017-01-14 NOTE — Care Management Note (Signed)
Case Management Note  Patient Details  Name: Todd Holden MRN: [id number] Date of Birth: 1956/05/06  Subjective/Objective:                    Action/Plan:  Patient has [place] letter dated 01-14-17 to 01-23-17 , and clinic appointment.   Patient will be discharging home with foley catheter . Attempted to arrange home health RN , however after calling multiple agencies unable to find one to accept patient due to home address and no insurance. Bedside nurse aware and will provide teaching before discharge.   Patient aware and voiced understanding .  Expected Discharge Date:  01/12/17               Expected Discharge Plan:  Home/Self Care  In-House Referral:  NA  Discharge planning Services  CM Consult, [place], [facility]  Post Acute Care Choice:  NA Choice offered to:  Patient  DME Arranged:  N/A DME Agency:  NA  HH Arranged:  NA HH Agency:  NA  Status of Service:  Completed, signed off  If discussed at [place] of Stay Meetings, dates discussed:    Additional Comments:  Marilu Favre, RN 01/14/2017, 11:12 AM

## 2017-01-14 NOTE — Progress Notes (Signed)
Patient discharged home with instructions, prescriptions, and at home foley care instructions.

## 2017-01-14 NOTE — Discharge Summary (Addendum)
Physician Discharge Summary  Todd Holden NIO:[id number] DOB: 1956/06/18 DOA: 01/08/2017  PCP: Pcp Not In System  Admit date: 01/08/2017 Discharge date: 01/14/2017  Time spent: 65 minutes  Recommendations for Outpatient Follow-up:  1. Follow-up with Dr. Carlean Purl gastroenterology 03/02/2017. On follow-up patient may need repeat surveillance upper endoscopy. 2. Follow-up with Dr. Diona Fanti, urology. Office will call with an appointment time. On follow-up patient will need a renal panel to follow-up on electrolytes and renal function. Patient will also need a voiding trial. Patient also need further evaluation and biopsy of prostate. 3. Follow-up with PCP,[facility] 01/19/2017. On follow-up patient will need a basic metabolic profile done to follow-up on electrolytes and renal function. Patient also need a CBC done to follow-up on H&H.   Discharge Diagnoses:  Principal Problem:   GI bleed Active Problems:   Acute renal failure (ARF) (HCC) with probable underlying stage III CKD   Acute urinary retention   Hematemesis   Acute blood loss anemia   BPH (benign prostatic hyperplasia)   Leukocytosis   Hypokalemia   Hypomagnesemia   Hydronephrosis   Abnormal prostate exam   Discharge Condition: Stable and improved  Diet recommendation: Regular  Filed Weights   01/08/17 0056 01/09/17 0740  Weight: 86.2 kg (190 lb) 86.2 kg (190 lb)    History of present illness:  Per Dr. Trude Mcburney Todd Holden is a 61 y.o. male with medical history significant of HTN and BPH; who presented with complaints of 4-5 days of vomiting up black stuff. Patient notes that he's not been under the care of primary doctor in quite some time. He states that he's been vomiting black material at least 3 times per day. Unable to tolerate any oral food or liquids. During this time he reports having left-sided flank pain that he describes as throbbing and constant. He reported having issues with his prostate  for some time. Reports normally only being able to urinate a small amount of urine at a time and is frequently up multiple times throughout the night. Here lately patient reports noticing a foul smell to his urine. Associated symptoms included generalized malaise, abdominal pain, subjective fevers, lightheadedness, take of this, and intermittent shortness of breath. Admits to use of [employer] brand Advil approximately 2 times per day for at least 1 week. Patient denies any alcohol, tobacco, or illicit drug use. He was initially seen at [facility] where patient was noted to have stable vital signs. Labs revealed increase 16.3, hemoglobin 11.5, BUN 55,  and creatinine 5. His baseline kidney function is unknown. His x-ray showed borderline cardiomegaly with no acute infiltrate or effusion. He received 80 mg bolus of Protonix and 1 L of IV fluids.The patient was transferred to [facility] to a med-surg bed for need of GI /nephrology consultative services.   Hospital Course:  #1 hematemesis/upper GI bleed secondary to [place] grade D esophagitis with clipping to arterial bleeding Patient presented with hematemesis and underwent upper endoscopy on 01/09/2017 which showed LA grade D esophagitis status post clipping to arterial bleeding. Patient was on Protonix IV subsequently transitioned to oral Protonix twice a day. Patient with no further GI bleed. Hemoglobin has remained stable. Patient tolerated a solid diet. Per GI patient is to be on a PPI twice a day for at least 2 months. Patient will also likely need repeat surveillance EGD in 2 months to assess for possible underlying Barrett's esophagus. Patient will be discharged in stable and improved condition.   #2 acute  urinary retention secondary to bladder outlet obstruction with subsequent hydronephrosis bilaterally Questionable etiology. Patient with history of BPH. Foley catheter placed with good urine output and symptomatic relief. Patient was started on  Flomax. Urology was consulted.CT abdomen and pelvis without contrast stone protocol did show persistent bilateral hydronephrosis and hydroureter despite Foley catheter placement and relative bladder decompression. Was also noted was diffuse bladder wall thickening, perivesicular and perirectal soft tissue stranding concerning for cystitis of chronic bladder outlet obstruction. Prostatic neoplasm cannot be excluded as also noted was right pelvic lymphadenopathy. Patient was seen in consultation by urology, Dr.Dahlstedt who noted that patient had an exceedingly abnormal prostate exam suspicious for prostate cancer. PSA was ordered which came back elevated at 75.65. It was recommended per urology that patient be discharged home with a Foley catheter with close outpatient follow-up with urology for further evaluation, repeat ultrasound and biopsy.  D/C creatinine elevated and GFR consistent with underlying stage III CKD.  #3 bilateral hydronephrosis right greater than left Noted on renal ultrasound from 01/08/2017. Patient had been undergoing I/O cat during the hospitalization,  however patient still with urinary retention. Foley catheter  was subsequently placed with good urine output. Patient was started on Flomax. Patient's renal function trended down since admission and was down to 1.92 on day of discharge from 5.55 on admission. Urology consultation was obtained and CT abdomen and pelvis without contrast stone protocol was also obtained for further evaluation. CT abdomen and pelvis they show persistent bilateral hydronephrosis and hydroureter despite Foley catheter placement and relative bladder decompression. Was also noted was diffuse bladder wall thickening, perivesicular and perirectal soft tissue stranding concerning for cystitis of chronic bladder outlet obstruction. Prostatic neoplasm cannot be excluded as also noted was right pelvic lymphadenopathy. Patient was seen in consultation by urology,  Dr.Dahlstedt who noted that patient had an exceedingly abnormal prostate exam suspicious for prostate cancer. PSA was ordered which came back elevated at 75.65. It was recommended per urology that patient be discharged home with a Foley catheter with close outpatient follow-up with urology for further evaluation, repeat ultrasound and biopsy.   #4 hypokalemia/hypomagnesemia Repleted.  #5 acute renal failure Unknown baseline. Likely post renal azotemia as patient noted to have bilateral hydronephrosis right greater than left on renal ultrasound done on 01/08/2017 as well as urinary retention. Initially during the hospitalization patient was undergoing I and O cath however due to ongoing urinary retention Foley catheter placed with good urine output. Renal function has trended down and creatinine was 1.92 on day of discharge from 5.55 on admission. Patient was seen in consultation by urology and patient will follow-up with urology in the outpatient.  #6 acute blood loss anemia Secondary to problem #1. H&H remained stable. Outpatient follow-up.   #7 leukocytosis Patient with no signs or symptoms of infection. Urinalysis was nitrite negative leukocytes negative. Patient with no respiratory symptoms. WBC trended down and leukocytosis had resolved by day of discharge.  #8 BPH Patient placed on Flomax during the hospitalization. Foley cath was placed secondary to urinary retention. Patient was seen in consultation by urology and patient will follow-up in the outpatient setting.  #9 abnormal prostate exam/elevated PSA During patient's evaluation for acute urinary retention and bilateral hydronephrosis patient was seen in consultation by urology who noted a exceedingly abnormal prostate exam. PSA was obtained which came back elevated at 75.65. Patient was on Flomax. Patient is to follow-up with urology in the outpatient setting for further evaluation and likely repeat ultrasound and  biopsy.   Marland Kitchen  Procedures:  Renal ultrasound 01/08/2017  CT abdomen and pelvis stone protocol 01/13/2017  Upper endoscopy 01/09/2017 Per Dr Benson Norway  Consultations:  GI: Dr Carlean Purl 01/08/2017  Urology Dr. Diona Fanti 01/13/2017  Discharge Exam: Vitals:   01/14/17 0434 01/14/17 1347  BP: 134/70 138/82  Pulse: 74 82  Resp: 18   Temp: 97.5 F (36.4 C) 98.5 F (36.9 C)    General: NAD Cardiovascular: RRR Respiratory: CTAB  Discharge Instructions   Discharge Instructions    Call MD for:  extreme fatigue    Complete by:  As directed    Call MD for:  persistant nausea and vomiting    Complete by:  As directed    Call MD for:  redness, tenderness, or signs of infection (pain, swelling, redness, odor or green/yellow discharge around incision site)    Complete by:  As directed    Call MD for:  temperature >100.4    Complete by:  As directed    Diet - low sodium heart healthy    Complete by:  As directed    Diet general    Complete by:  As directed    Discharge instructions    Complete by:  As directed    BID PPI x at least 2 months.  Surveillance EGD recommended in 8-12 weeks with Dr. Carlean Purl   Increase activity slowly    Complete by:  As directed    Increase activity slowly    Complete by:  As directed      Current Discharge Medication List    START taking these medications   Details  pantoprazole (PROTONIX) 40 MG tablet Take 1 tablet (40 mg total) by mouth 2 (two) times daily. Qty: 60 tablet, Refills: 2    tamsulosin (FLOMAX) 0.4 MG CAPS capsule Take 1 capsule (0.4 mg total) by mouth daily after supper. Qty: 30 capsule, Refills: 2      STOP taking these medications     ibuprofen (ADVIL,MOTRIN) 200 MG tablet        No Known Allergies Follow-up [facility] in [place] Follow up.   Why:  has appointment on 01/19/2017 at 8:30 am Contact information: [place]       Silvano Rusk, MD Follow up on  03/02/2017.   Specialty:  Gastroenterology Why:  1:45 PM with stomach, digestive disease MD.   Contact information: [address] [address] [phone]        [name], MD Follow up.   Specialty:  Urology Why:  We will call you for follow-up. Contact information: [place] [address] [phone]            The results of significant diagnostics from this hospitalization (including imaging, microbiology, ancillary and laboratory) are listed below for reference.    Significant Diagnostic Studies: Ct Abdomen Pelvis Wo Contrast  Result Date: 01/13/2017 CLINICAL DATA:  Chronic dysuria and hydronephrosis for several years with recent worsening. Acute renal failure. History of BPH. EXAM: CT ABDOMEN AND PELVIS WITHOUT CONTRAST TECHNIQUE: Multidetector CT imaging of the abdomen and pelvis was performed following the standard protocol without IV contrast. COMPARISON:  Renal ultrasound 01/08/2017. FINDINGS: Lower chest: 5 mm right middle lobe nodule on image number 4 is slightly ill-defined. The lung bases are otherwise clear. No significant pleural or pericardial effusion. There is a small hiatal hernia. Hepatobiliary: The hepatic density is decreased consistent with steatosis. There is mild sparing around  the gallbladder. No focal hepatic lesions are identified on noncontrast imaging. No evidence of gallstones, gallbladder wall thickening or biliary dilatation. Pancreas: Unremarkable. No pancreatic ductal dilatation or surrounding inflammatory changes. Spleen: Normal in size without focal abnormality. Adrenals/Urinary Tract: Both adrenal glands appear normal. Both kidneys demonstrate mild cortical thinning. As seen on recent ultrasound, there is mild-to-moderate symmetric hydronephrosis and hydroureter. Both ureters are dilated to the ureterovesical junctions. No evidence of urinary tract calculus. 1.5 cm exophytic lesion projecting from the upper pole  of the left kidney is best seen on the reformatted images, measuring higher than water density (16-19 HU). No cyst was seen in this area on recent ultrasound. There is marked bladder wall thickening. Foley catheter is in place with air in the bladder lumen. Stomach/Bowel: No evidence of bowel wall thickening, distention or surrounding inflammatory change. The appendix appears normal. Vascular/Lymphatic: Suspected right pelvic sidewall adenopathy, not optimally evaluated on this noncontrast study, but measuring approximately 14 mm on image 68 and 11 mm on image 72. Small retroperitoneal lymph nodes are present bilaterally no acute vascular findings are seen on noncontrast imaging. There is minimal aortic and branch vessel atherosclerosis. Reproductive: Mild enlargement of the prostate gland with asymmetric central dystrophic calcifications on the right. The seminal vesicles appear normal. Other: There is soft tissue stranding throughout the perivesical and perirectal fat. No focal fluid collections are seen. Musculoskeletal: No acute or significant osseous findings. There are mild endplate degenerative changes in the lumbar spine. IMPRESSION: 1. Persistent bilateral hydronephrosis and hydroureter despite Foley catheter placement and relative bladder decompression. There is associated diffuse bladder wall thickening, perivesical and perirectal soft tissue stranding. Findings could be secondary to cystitis or chronic bladder outlet obstruction. However, bladder and prostatic neoplasm cannot be excluded, and suspicion of neoplasm is raised by the presence of right pelvic lymphadenopathy. Correlation with serum PSA and urine cytology recommended. 2. Indeterminate small exophytic lesion projecting from the upper pole the left kidney. This could reflect a small solid mass or complex cyst, not seen on recent ultrasound. 3. Hepatic steatosis. 4. 5 mm right middle lobe pulmonary nodule. This appearance is almost certainly  benign, and no dedicated follow-up is required if this patient is low risk for bronchogenic carcinoma (and has no known or suspected primary neoplasm). Non-contrast chest CT can be considered in 12 months if patient is high-risk. If the patient proves to have pelvic neoplasm, full chest CT may be considered. This recommendation follows the consensus statement: Guidelines for Management of Incidental Pulmonary Nodules Detected on CT Images: From the Fleischner Society 2017; Radiology 2017; 284:228-243. Electronically Signed   By: Richardean Sale M.D.   On: 01/13/2017 17:01   US Renal  Result Date: 01/08/2017 CLINICAL DATA:  Acute renal failure. EXAM: RENAL / URINARY TRACT ULTRASOUND COMPLETE COMPARISON:  None. FINDINGS: Right Kidney: Length: 12.9 cm. Echogenicity within normal limits. Moderate right-sided hydronephrosis is present. The proximal right ureter is dilated as well. No obstructing lesion is evident. Parenchymal echogenicity is within normal limits. Left Kidney: Length: 10.9 cm, within normal limits. Echogenicity within normal limits. No mass is visualized. A small amount of perinephric fluid is noted at the lower pole. Mild left-sided hydronephrosis is present. No obstructing lesion is evident. Bladder: A Foley catheter is present. IMPRESSION: 1. Bilateral hydronephrosis, right greater than left. 2. Mild perinephric fluid at the lower pole of the left kidney. 3. No obstructing lesions are visualized. 4. Foley catheter is present within the bladder. Electronically Signed   By: Harrell Gave  Mattern M.D.   On: 01/08/2017 11:30    Microbiology: Recent Results (from the past 240 hour(s))  Urine culture     Status: None   Collection Time: 01/08/17  5:15 AM  Result Value Ref Range Status   Specimen Description URINE, CATHETERIZED  Final   Special Requests NONE  Final   Culture NO GROWTH  Final   Report Status 01/09/2017 FINAL  Final  Urine culture     Status: None   Collection Time: 01/13/17  11:42 AM  Result Value Ref Range Status   Specimen Description URINE, CATHETERIZED  Final   Special Requests NONE  Final   Culture NO GROWTH  Final   Report Status 01/14/2017 FINAL  Final     Labs: Basic Metabolic Panel:  Recent Labs Lab 01/08/17 0257 01/09/17 0554 01/10/17 0418 01/13/17 0923 01/14/17 0242  NA 139 142 144 140 139  K 3.8 3.3* 3.1* 3.0* 3.7  CL 100* 108 109 104 104  CO2 24 24 25 25 28   GLUCOSE 131* 105* 94 161* 111*  BUN 56* 34* 24* 19 16  CREATININE 5.55* 2.62* 2.14* 1.91* 1.92*  CALCIUM 9.5 8.9 9.1 9.1 8.7*  MG  --   --   --  1.6* 2.3  PHOS  --   --   --   --  3.0   Liver Function Tests:  Recent Labs Lab 01/08/17 0257 01/14/17 0242  AST 24  --   ALT 18  --   ALKPHOS 61  --   BILITOT 1.1  --   PROT 7.3  --   ALBUMIN 4.1 3.1*   No results for input(s): LIPASE, AMYLASE in the last 168 hours. No results for input(s): AMMONIA in the last 168 hours. CBC:  Recent Labs Lab 01/08/17 0257  01/09/17 0554 01/10/17 0418 01/12/17 0331 01/13/17 0923 01/14/17 0242  WBC 15.4*  --  12.2* 10.6* 8.4 10.6* 9.3  NEUTROABS 13.0*  --   --   --   --  8.0* 5.2  HGB 10.9*  < > 10.7* 11.1* 10.4* 10.9* 10.1*  HCT 31.1*  < > 31.5* 32.2* 30.3* 31.3* 29.9*  MCV 88.4  --  90.3 91.0 89.4 88.7 90.1  PLT 220  --  230 255 287 286 260  < > = values in this interval not displayed. Cardiac Enzymes: No results for input(s): CKTOTAL, CKMB, CKMBINDEX, TROPONINI in the last 168 hours. BNP: BNP (last 3 results) No results for input(s): BNP in the last 8760 hours.  ProBNP (last 3 results) No results for input(s): PROBNP in the last 8760 hours.  CBG: No results for input(s): GLUCAP in the last 168 hours.     SignedIrine Seal MD.  [facility] 01/14/2017, 3:12 PM

## 2017-02-12 DIAGNOSIS — C801 Malignant (primary) neoplasm, unspecified: Secondary | ICD-10-CM

## 2017-02-12 HISTORY — DX: Malignant (primary) neoplasm, unspecified: C80.1

## 2017-02-17 ENCOUNTER — Other Ambulatory Visit: Payer: Self-pay | Admitting: Urology

## 2017-02-17 DIAGNOSIS — C61 Malignant neoplasm of prostate: Secondary | ICD-10-CM

## 2017-02-22 ENCOUNTER — Telehealth: Payer: Self-pay | Admitting: Medical Oncology

## 2017-02-22 NOTE — Telephone Encounter (Signed)
I called pt to introduce myself as the Prostate Nurse Navigator and the Coordinator of the Prostate [place].  1. I confirmed with the patient he is aware of his referral to the clinic June 26, arriving at 12:15pm.   2. I discussed the format of the clinic and the physicians he will be seeing that day. He asked about co-pays for the visit. He states he has no insurance. I will have see financial counseling here at [facility].  3. I discussed where the clinic is located and how to contact me. I informed him of the valet parking for [place] and the [facility]. He has a bone scan this week at [facility].  4. I confirmed his address and informed him I would be mailing a packet of information and forms to be completed. I asked him to bring them with him the day of his appointment.   He voiced understanding of the above. I asked him to call me if he has any questions or concerns regarding his appointments or the forms he needs to complete.

## 2017-02-24 ENCOUNTER — Encounter (HOSPITAL_COMMUNITY)
Admission: RE | Admit: 2017-02-24 | Discharge: 2017-02-24 | Disposition: A | Discharge status: Home / Self Care | Payer: Medicaid Other | Source: Ambulatory Visit | Attending: Urology | Admitting: Urology

## 2017-02-24 ENCOUNTER — Ambulatory Visit (HOSPITAL_COMMUNITY)
Admission: RE | Admit: 2017-02-24 | Discharge: 2017-02-24 | Disposition: A | Discharge status: Home / Self Care | Payer: Medicaid Other | Source: Ambulatory Visit | Attending: Urology | Admitting: Urology

## 2017-02-24 DIAGNOSIS — R937 Abnormal findings on diagnostic imaging of other parts of musculoskeletal system: Secondary | ICD-10-CM | POA: Insufficient documentation

## 2017-02-24 DIAGNOSIS — C61 Malignant neoplasm of prostate: Secondary | ICD-10-CM | POA: Diagnosis not present

## 2017-02-24 DIAGNOSIS — R938 Abnormal findings on diagnostic imaging of other specified body structures: Secondary | ICD-10-CM | POA: Insufficient documentation

## 2017-02-24 MED ORDER — TECHNETIUM TC 99M MEDRONATE IV KIT
25.0000 | PACK | Freq: Once | INTRAVENOUS | Status: AC | PRN
  Administered 2017-02-24: 21 via INTRAVENOUS

## 2017-02-25 ENCOUNTER — Encounter: Payer: Self-pay | Admitting: Medical Oncology

## 2017-02-25 NOTE — Progress Notes (Signed)
I spoke with Almyra Free at [facility] to request  prostate pathology slides for prostate MDC.

## 2017-03-02 ENCOUNTER — Ambulatory Visit (INDEPENDENT_AMBULATORY_CARE_PROVIDER_SITE_OTHER): Payer: Medicaid Other | Admitting: Internal Medicine

## 2017-03-02 ENCOUNTER — Encounter: Payer: Self-pay | Admitting: Internal Medicine

## 2017-03-02 VITALS — BP 110/68 | HR 72 | Ht 71.75 in | Wt 194.0 lb

## 2017-03-02 DIAGNOSIS — R490 Dysphonia: Secondary | ICD-10-CM

## 2017-03-02 DIAGNOSIS — K2100 Gastro-esophageal reflux disease with esophagitis, without bleeding: Secondary | ICD-10-CM | POA: Insufficient documentation

## 2017-03-02 MED ORDER — PANTOPRAZOLE SODIUM 40 MG PO TBEC: 40.0000 mg | DELAYED_RELEASE_TABLET | Freq: Every day | ORAL | 11 refills | Status: AC

## 2017-03-02 NOTE — Patient Instructions (Signed)
  We have sent the following medications to your pharmacy for you to pick up at your convenience: Pantoprazole   We will put you in for an EGD recall for December 2018.    I appreciate the opportunity to care for you. Silvano Rusk, MD, [facility]

## 2017-03-02 NOTE — Progress Notes (Signed)
Todd Holden 61 y.o. July 09, 1956 [id number]  Assessment & Plan:   Encounter Diagnoses  Name Primary?  . Gastroesophageal reflux disease with esophagitis Yes  . Hoarseness     He is symptomatically improved. Will reduce pantoprazole to 40 mg daily. He has his hands folded new diagnosis of metastatic prostate cancer. I think that will take precedence. I'm not sure why he has some hoarseness at this time. Don't think it's related to GERD as he is on treatment. He may need to see otolaryngology.  At this point plan to do a recall in 6 months were all regroup in looking over his chart. I don't think it makes sense to repeat an EGD at this time. At some point a screening colonoscopy or other colon cancer screening could make sense but in the setting of metastatic prostate cancer prior to initiating therapy I don't think it does.  I will send copies to Dr. Preston Fleeting Subjective:   Chief Complaint: Ulcerative esophagitis, follow-up  HPI The patient is a very nice 61 year old white man, an unemployed Clinical biochemist, who has a history of hematemesis and severe ulcerative esophagitis found at EGD in April of this year. He had one area clipped due to persistent bleeding. Dr. Benson Norway performed at EGD after I had seen the patient in consultation. Subsequently he has done well on twice a day pantoprazole 40 mg. No heartburn or vomiting symptoms. He also feels like his been hoarse lately. Denies any postnasal drip or sinus drainage.  Unfortunately he has prostate cancer that appears to be metastatic to the right acetabulum and is waiting therapy decision her recommendations after the patient's case is reviewed at a multidisciplinary cancer conference for prostate cancer. No Known Allergies Current Meds  Medication Sig  . pantoprazole (PROTONIX) 40 MG tablet Take 1 tablet (40 mg total) by mouth daily before breakfast.  . [DISCONTINUED] pantoprazole (PROTONIX) 40 MG tablet Take 1 tablet (40 mg  total) by mouth 2 (two) times daily.   Past Medical History:  Diagnosis Date  . ARF (acute renal failure) ([place])   . Hiatal hernia   . Hydronephrosis   . Hypertension   . [place] grade D esophagitis   . Prostate enlargement 2008   Past Surgical History:  Procedure Laterality Date  . ESOPHAGOGASTRODUODENOSCOPY N/A 01/09/2017   Procedure: ESOPHAGOGASTRODUODENOSCOPY (EGD);  Surgeon: Carol Ada, MD;  Location: [facility] ENDOSCOPY;  Service: Endoscopy;  Laterality: N/A;  . TONSILLECTOMY     61 years old   Social History   Social History  . Marital status: Divorced    Spouse name: N/A  . Number of children: N/A  . Years of education: N/A   Occupational History  . Not on file.   Social History Main Topics  . Smoking status: Former Smoker    Packs/day: 1.00    Types: Cigarettes    Quit date: 47  . Smokeless tobacco: Never Used  . Alcohol use No  . Drug use: Yes    Types: Marijuana     Comment: quit 06/2016   Social History Narrative   Patient is divorced. 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   03/02/2017        Review of Systems As above has left sided hip pain  Objective:   Physical Exam BP 110/68 (BP Location: Left Arm, Patient Position: Sitting, Cuff Size: Normal)   Pulse 72   Ht 5' 11.75" (1.822 m)  Comment: height measured without shoes  Wt 194 lb (88 kg)   BMI 26.49 kg/m  No acute distress

## 2017-03-08 ENCOUNTER — Telehealth: Payer: Self-pay | Admitting: Medical Oncology

## 2017-03-08 NOTE — Telephone Encounter (Signed)
Attempted to reach Todd Holden to confirm appointment for Prostate [facility] 03/09/17 but no answering machine.

## 2017-03-09 ENCOUNTER — Ambulatory Visit
Admission: RE | Admit: 2017-03-09 | Discharge: 2017-03-09 | Disposition: A | Discharge status: Home / Self Care | Payer: Medicaid Other | Source: Ambulatory Visit | Attending: Radiation Oncology | Admitting: Radiation Oncology

## 2017-03-09 ENCOUNTER — Encounter: Payer: Self-pay | Admitting: Medical Oncology

## 2017-03-09 ENCOUNTER — Ambulatory Visit (HOSPITAL_BASED_OUTPATIENT_CLINIC_OR_DEPARTMENT_OTHER): Payer: Medicaid Other | Admitting: Oncology

## 2017-03-09 VITALS — BP 144/83 | HR 63 | Temp 98.1°F | Resp 16 | Ht 72.0 in | Wt 196.4 lb

## 2017-03-09 DIAGNOSIS — I10 Essential (primary) hypertension: Secondary | ICD-10-CM | POA: Diagnosis not present

## 2017-03-09 DIAGNOSIS — N1330 Unspecified hydronephrosis: Secondary | ICD-10-CM | POA: Diagnosis not present

## 2017-03-09 DIAGNOSIS — K449 Diaphragmatic hernia without obstruction or gangrene: Secondary | ICD-10-CM | POA: Insufficient documentation

## 2017-03-09 DIAGNOSIS — C61 Malignant neoplasm of prostate: Secondary | ICD-10-CM | POA: Diagnosis present

## 2017-03-09 DIAGNOSIS — N400 Enlarged prostate without lower urinary tract symptoms: Secondary | ICD-10-CM | POA: Insufficient documentation

## 2017-03-09 DIAGNOSIS — Z87891 Personal history of nicotine dependence: Secondary | ICD-10-CM | POA: Diagnosis not present

## 2017-03-09 NOTE — Progress Notes (Signed)
Reason for Referral: Prostate cancer.   HPI: 61 year old gentleman currently of [place] referred for the evaluation of prostate cancer. Gentleman in reasonable good health without any significant comorbid conditions the presented with urinary retention and elevated PSA to 75.65. He has subsequently had a Foley catheter inserted and started on Flomax. A repeat PSA in May 2018 was 68.90 with a creatinine of 1.6. Prostate biopsy completed by Dr. Diona Fanti showed a Gleason score 4+4 = 8 and 11 out of 12 cores. His staging workup including CT scan of the abdomen and pelvis showed possible reactive pelvic adenopathy and his bone scan did show potentially sclerotic lesion in his pelvis. Clinically, he still has some difficulty with urination and requires self-catheterization. He reports some pelvic discomfort associated with that especially after the biopsy. His performance status and activity level has not dramatically changed. He continues to perform activities of daily living without any hindrance or decline.  He does not report any headaches, blurry vision, syncope or seizures. He does not report any fevers, chills or sweats. He does not report any cough, wheezing or hemoptysis. He does not report any chest pain, palpitation orthopnea. He does not report any nausea, vomiting or abdominal pain. He does not report any skeletal complaints. Remaining review of systems unremarkable.   Past Medical History:  Diagnosis Date  . ARF (acute renal failure) ([place])   . Hiatal hernia   . Hydronephrosis   . Hypertension   . [place] grade D esophagitis   . Prostate enlargement 2008  :  Past Surgical History:  Procedure Laterality Date  . ESOPHAGOGASTRODUODENOSCOPY N/A 01/09/2017   Procedure: ESOPHAGOGASTRODUODENOSCOPY (EGD);  Surgeon: Carol Ada, MD;  Location: [facility] ENDOSCOPY;  Service: Endoscopy;  Laterality: N/A;  . TONSILLECTOMY     61 years old  :   Current Outpatient Prescriptions:  .  pantoprazole  (PROTONIX) 40 MG tablet, Take 1 tablet (40 mg total) by mouth daily before breakfast., Disp: 30 tablet, Rfl: 11:  No Known Allergies:  Family History  Problem Relation Age of Onset  . Diabetes Father   . Parkinson's disease Father   :  Social History   Social History  . Marital status: Divorced    Spouse name: N/A  . Number of children: N/A  . Years of education: N/A   Occupational History  . Not on file.   Social History Main Topics  . Smoking status: Former Smoker    Packs/day: 1.00    Types: Cigarettes    Quit date: 35  . Smokeless tobacco: Never Used  . Alcohol use No  . Drug use: Yes    Types: Marijuana     Comment: quit 06/2016  . Sexual activity: Not on file   Other Topics Concern  . Not on file   Social History Narrative   Patient is divorced. 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   03/02/2017     :  Pertinent items are noted in HPI.  Exam: ECOG 0  General appearance: alert and cooperative appeared without distress. Neck: no adenopathy Back: negative Resp: clear to auscultation bilaterally without wheezes or dullness to percussion. Cardio: regular rate and rhythm, S1, S2 normal, no murmur, click, rub or gallop GI: soft, non-tender; bowel sounds normal; no masses,  no organomegaly Extremities: extremities normal, atraumatic, no cyanosis or edema Skin: Skin color, texture, turgor normal. No rashes or lesions Lymph nodes: Cervical, supraclavicular, and axillary nodes normal.  .  CBC    Component Value Date/Time   WBC 9.3 01/14/2017 0242   RBC 3.32 (L) 01/14/2017 0242   HGB 10.1 (L) 01/14/2017 0242   HCT 29.9 (L) 01/14/2017 0242   PLT 260 01/14/2017 0242   MCV 90.1 01/14/2017 0242   MCH 30.4 01/14/2017 0242   MCHC 33.8 01/14/2017 0242   RDW 12.5 01/14/2017 0242   LYMPHSABS 2.7 01/14/2017 0242   MONOABS 0.7 01/14/2017 0242   EOSABS 0.7 01/14/2017 0242    BASOSABS 0.0 01/14/2017 0242     Chemistry      Component Value Date/Time   NA 139 01/14/2017 0242   K 3.7 01/14/2017 0242   CL 104 01/14/2017 0242   CO2 28 01/14/2017 0242   BUN 16 01/14/2017 0242   CREATININE 1.92 (H) 01/14/2017 0242      Component Value Date/Time   CALCIUM 8.7 (L) 01/14/2017 0242   ALKPHOS 61 01/08/2017 0257   AST 24 01/08/2017 0257   ALT 18 01/08/2017 0257   BILITOT 1.1 01/08/2017 0257       Nm Bone Scan Whole Body  Result Date: 02/24/2017 CLINICAL DATA:  Recently diagnosed with prostate malignancy EXAM: NUCLEAR MEDICINE WHOLE BODY BONE SCAN TECHNIQUE: Whole body anterior and posterior images were obtained approximately 3 hours after intravenous injection of radiopharmaceutical. RADIOPHARMACEUTICALS:  Twenty-two mCi Technetium-13m MDP IV COMPARISON:  None in PACs FINDINGS: There is adequate uptake of the radiopharmaceutical by the skeleton. Adequate soft tissue clearance and renal activity is observed. There is no evidence of hydronephrosis. There is intensely increased uptake in the medial aspect of the right acetabulum. There is punctate increased uptake on the left in the costovertebral junction. Uptake within the calvarium, spine, upper extremities, and lower extremities exhibits no pattern to suggest metastatic disease. IMPRESSION: Abnormal increased uptake that projects over the medial aspect of the right acetabulum worrisome for metastatic disease. No bony abnormality was noted here on the CT scan of the abdomen and pelvis of Jan 13, 2017. A right hip series would be a useful next imaging step. The tiny focus of increased uptake of the left seventh costovertebral junction would likely be inapparent on a rib series. Electronically Signed   By: David  Martinique M.D.   On: 02/24/2017 14:02    Assessment and Plan:   61 year old gentleman with prostate cancer diagnosed in May 2018. He presented with urinary retention and a PSA is elevated to 66.90. He underwent a  biopsy to confirm the presence of Gleason score 4+4 = 8 with high volume disease. His staging workup did not show widespread metastasis but there is some suggestion of sclerotic bone lesion in the right acetabulum.  His case was discussed today and the prostate cancer multidisciplinary clinic and these findings were discussed with the patient and his brother. The first step is to complete his metastatic disease workup starting with an MRI of the pelvis and potentially fluoride PET scan. Given his urinary retention, which an alternative in addition to androgen deprivation therapy would be the next step.  Depending on his staging workup, potential definitive radiation therapy can be added to androgen deprivation if he has no widespread metastatic disease. The role for systemic therapy was discussed today with the patient in detail. Complications associated with androgen deprivation therapy were reviewed today that include hot flashes, weight gain, sexual dysfunction among others. The duration of androgen deprivation therapy was discussed today which would be at least 2-3 years potentially long-term depending if he has measurable disease in other  areas.  The role for additional therapy such as chemotherapy or Zytiga were also reviewed depending on the volume of metastasis is a do exist.  All his questions were answered today to his satisfaction.

## 2017-03-09 NOTE — Progress Notes (Signed)
Radiation Oncology         [phone] ________________________________  Multidisciplinary Prostate Cancer Clinic  Initial Radiation Oncology Consultation  Name: Joshia Kitchings MRN: [id number]  Date: 03/09/2017  DOB: April 17, 1956  TG:GYIRSWN, No Pcp Per  Raynelle Bring, MD   REFERRING PHYSICIAN: Raynelle Bring, MD  DIAGNOSIS: 61 y.o. gentleman with stage TT2a adenocarcinoma of the prostate with a Gleason's score of 4+4 and a PSA of 66.9.    ICD-10-CM   1. Malignant neoplasm of prostate ([place]) C61     HISTORY OF PRESENT ILLNESS::Kairav Ray Noga is a 60 y.o. gentleman.  He was initially seen by Dr. Diona Fanti on 01/13/17 during an admission at [facility] for urinary retention, decreased renal function and bilateral hydronephrosis.  On exam, he was noted to have a significantly nodular prostate and elevated PSA of 75.65.   CT A/P was performed 01/13/17 which revealed some right pelvic sidewall adenopathy measuring 23mm and small retroperitoneal nodes bilaterally.  He was treated with foley catheter placement to relieve the AUR and did note improved hydronephrosis, improved renal function and relief of suprapubic discomfort and distention.  He was seen back in the office with Dr. Diona Fanti on 01/21/17 for voiding trail.  Unfortunatley, he failed the voiding trial and was taught [facility] at that time.  PSA was repeated at that visit and remained elevated at 66.9.  The patient proceeded to transrectal ultrasound with 12 biopsies of the prostate on 02/05/17 revealing multiple hyperechoic areas in the right prostatic lobe.  The prostate volume measured 55.6 cc.  Out of 12 core biopsies,11 were positive.  The maximum Gleason score was 4+4, and this was seen in all cores with the exception of the right base.  A bone scan on 02/24/17 revealed increased uptake in the right acetabulum which was not noted previously on CT scan and suspicious for metastatic disease.  The patient reviewed the biopsy results  with his urologist and he has kindly been referred today to the multidisciplinary prostate cancer clinic for presentation of pathology and radiology studies in our conference for discussion of potential radiation treatment options and clinical evaluation.   PREVIOUS RADIATION THERAPY: No  PAST MEDICAL HISTORY:  has a past medical history of ARF (acute renal failure) ([place]); Hiatal hernia; Hydronephrosis; Hypertension; [place] grade D esophagitis; and Prostate enlargement (2008).    PAST SURGICAL HISTORY: Past Surgical History:  Procedure Laterality Date  . ESOPHAGOGASTRODUODENOSCOPY N/A 01/09/2017   Procedure: ESOPHAGOGASTRODUODENOSCOPY (EGD);  Surgeon: Carol Ada, MD;  Location: [facility] ENDOSCOPY;  Service: Endoscopy;  Laterality: N/A;  . TONSILLECTOMY     61 years old    FAMILY HISTORY: family history includes Diabetes in his father; Parkinson's disease in his father.  SOCIAL HISTORY:  reports that he quit smoking about 40 years ago. His smoking use included Cigarettes. He smoked 1.00 pack per day. He has never used smokeless tobacco. He reports that he uses drugs, including Marijuana. He reports that he does not drink alcohol.  ALLERGIES: Patient has no known allergies.  MEDICATIONS:  Current Outpatient Prescriptions  Medication Sig Dispense Refill  . pantoprazole (PROTONIX) 40 MG tablet Take 1 tablet (40 mg total) by mouth daily before breakfast. 30 tablet 11   No current facility-administered medications for this encounter.     REVIEW OF SYSTEMS:  On review of systems, the patient reports that he is doing well overall. He denies any chest pain, shortness of breath, cough, fevers, chills, night sweats, unintended weight changes. He denies any  bowel disturbances, and denies abdominal pain, nausea or vomiting. He denies any new musculoskeletal or joint aches or pains. His IPSS was 35, indicating severe urinary symptoms. He continues to [facility] as he is unable to void on his own otherwise.   He notes moderate ED.  A complete review of systems is obtained and is otherwise negative.  PHYSICAL EXAM:  Wt Readings from Last 3 Encounters:  03/09/17 196 lb 6.4 oz (89.1 kg)  03/02/17 194 lb (88 kg)  01/09/17 190 lb (86.2 kg)   Temp Readings from Last 3 Encounters:  03/09/17 98.1 F (36.7 C) (Oral)  01/14/17 98.5 F (36.9 C) (Oral)   BP Readings from Last 3 Encounters:  03/09/17 (!) 144/83  03/02/17 110/68  01/14/17 138/82   Pulse Readings from Last 3 Encounters:  03/09/17 63  03/02/17 72  01/14/17 82   Pain Assessment Pain Score: 0-No pain/10  In general this is a well appearing caucasian male in no acute distress. He is alert and oriented x4 and appropriate throughout the examination. HEENT reveals that the patient is normocephalic, atraumatic. EOMs are intact. PERRLA. Skin is intact without any evidence of gross lesions. Cardiovascular exam reveals a regular rate and rhythm, no clicks rubs or murmurs are auscultated. Chest is clear to auscultation bilaterally. Lymphatic assessment is performed and does not reveal any adenopathy in the cervical, supraclavicular, axillary, or inguinal chains. Abdomen has active bowel sounds in all quadrants and is intact. The abdomen is soft, non tender, non distended. Lower extremities are negative for pretibial pitting edema, deep calf tenderness, cyanosis or clubbing.  KPS = 90  100 - Normal; no complaints; no evidence of disease. 90   - Able to carry on normal activity; minor signs or symptoms of disease. 80   - Normal activity with effort; some signs or symptoms of disease. 32   - Cares for self; unable to carry on normal activity or to do active work. 60   - Requires occasional assistance, but is able to care for most of his personal needs. 50   - Requires considerable assistance and frequent medical care. 16   - Disabled; requires special care and assistance. 93   - Severely disabled; hospital admission is indicated although death  not imminent. 76   - Very sick; hospital admission necessary; active supportive treatment necessary. 10   - Moribund; fatal processes progressing rapidly. 0     - Dead  Karnofsky DA, [place]mann [place], Craver LS and Burchenal [facility] [phone]) The use of the nitrogen mustards in the palliative treatment of carcinoma: with particular reference to bronchogenic carcinoma Cancer 1 [id number]   LABORATORY DATA:  Lab Results  Component Value Date   WBC 9.3 01/14/2017   HGB 10.1 (L) 01/14/2017   HCT 29.9 (L) 01/14/2017   MCV 90.1 01/14/2017   PLT 260 01/14/2017   Lab Results  Component Value Date   NA 139 01/14/2017   K 3.7 01/14/2017   CL 104 01/14/2017   CO2 28 01/14/2017   Lab Results  Component Value Date   ALT 18 01/08/2017   AST 24 01/08/2017   ALKPHOS 61 01/08/2017   BILITOT 1.1 01/08/2017     RADIOGRAPHY: Nm Bone Scan Whole Body  Result Date: 02/24/2017 CLINICAL DATA:  Recently diagnosed with prostate malignancy EXAM: NUCLEAR MEDICINE WHOLE BODY BONE SCAN TECHNIQUE: Whole body anterior and posterior images were obtained approximately 3 hours after intravenous injection of radiopharmaceutical. RADIOPHARMACEUTICALS:  Twenty-two mCi Technetium-67m MDP IV COMPARISON:  None in  PACs FINDINGS: There is adequate uptake of the radiopharmaceutical by the skeleton. Adequate soft tissue clearance and renal activity is observed. There is no evidence of hydronephrosis. There is intensely increased uptake in the medial aspect of the right acetabulum. There is punctate increased uptake on the left in the costovertebral junction. Uptake within the calvarium, spine, upper extremities, and lower extremities exhibits no pattern to suggest metastatic disease. IMPRESSION: Abnormal increased uptake that projects over the medial aspect of the right acetabulum worrisome for metastatic disease. No bony abnormality was noted here on the CT scan of the abdomen and pelvis of Jan 13, 2017. A right hip series would be a useful  next imaging step. The tiny focus of increased uptake of the left seventh costovertebral junction would likely be inapparent on a rib series. Electronically Signed   By: David  Martinique M.D.   On: 02/24/2017 14:02      IMPRESSION/PLAN: 61 y.o. gentleman with a high risk, stage T2a adenocarcinoma of the prostate with a PSA of 66.9 and a Gleason score of 4+4.    We discussed the patient's workup and outlines the nature of prostate cancer in this setting. The patient's T stage, Gleason's score, and PSA put him into the high risk group. He does have some findings suggesting mets to lymph nodes and possibly pelvic skeleton.  Further imaging is warranted to clarify these findings.    If he has localized disease, he may be eligible for a variety of potential treatment options including 8 weeks of external radiation with LT-ADT. We would not recommend brachytherapy due to his current obstructive LUTS.  He may be a candidate for channel TURP for relief of his urinary obstruction with Dr. Diona Fanti prior to initiating radiotherapy if he decides against prostatectomy.    We discussed the available radiation techniques, and focused on the details and logistics and delivery of IMRT. We discussed and outlined the risks, benefits, short and long-term effects associated with radiotherapy and compared and contrasted these with prostatectomy. We also detailed the role of ADT in the treatment of high risk prostate cancer and outlined the associated side effects that could be expected with this therapy.    Consensus from the multidisciplinary prostate conference was to proceed with initiation of ADT, with plans to continue for a total of 2 years if he is a candidate for definitive treatment with radiotherapy.  Prior to moving forward with any definitive treatment, it is recommended that he proceed with pelvic MRI to further evaluate the pelvic adenopathy noted on CT scan from 01/13/17.  Additionally, he should consider a Sodium  Flouride PET scan for further evaluation of the right acetabular lesion found on bone scan dated 02/24/17.  In addition, he may benefit from channel TURP.  I would expect to see him back in 3-4 months after these studies and interventions to update recommendations.  At the end of the conversation the patient appears to have a good understanding of his disease and need for further evaluation prior to making final treatment recommendations.  He is in agreement to proceed with the additional imaging studies detailed above. He has not yet started ADT but this will be arranged through [facility] in the near future.  Dr. Alinda Money has indicated that he will move forward with scheduling the pelvic MRI and sodium fluoride PET scan.  He will follow up with Dr. Diona Fanti to review the results of these studies and further discussion of treatment recommendations based on those findings.  Further consideration  for TURP at that time as well.  We will plan a follow up visit in 2 months to allow for these studies to be completed and TURP if he is a candidate.   We spent 60 minutes face to face with the patient and more than 50% of that time was spent in counseling and/or coordination of care.      Nicholos Johns, PA-C    Tyler Pita, MD  [facility] Direct Dial: [phone]  Fax: [phone] [email or web address]  Skype  LinkedIn

## 2017-03-09 NOTE — Progress Notes (Signed)
[facility] Psychosocial Distress Screening Spiritual Care  Counselor and Shelva [facility], met with patient in Prostate Multidisciplinary Clinic to introduce [facility] team/resources, reviewing distress screen per protocol.  The patient scored a 6 on the Psychosocial Distress Thermometer which indicates Severe distress. Also assessed for distress and other psychosocial needs.   Patient reported nervousness and anxiety related to the diagnosis, as well as being in the hospital environment. Patient shared that he very rarely visits the doctor; his unfamiliarity with the hospital setting seems to cause his anxiety to increase. Patient stated that he normally copes with challenges by "flipping the script," but is finding that hard to do in this instance. Counselor and chaplain provided information on counseling support services, such as support groups, Healing Arts classes, and individual counseling.   Follow up needed: Yes; patient seemed unsure if his anxiety would decrease or increase after meeting with the rest of his treatment team and getting more information on the diagnosis and prognosis. Patient may benefit from a follow-up checking in on his anxiety as well as a reminding him of support resources.  Westly Pam, [place] [facility] [place]

## 2017-03-09 NOTE — Progress Notes (Signed)
                               Care Plan Summary  Name: Mr. Cuahutemoc Attar DOB: 09/27/1955   Your Medical Team:   Urologist -  Dr. Raynelle Bring, [facility]  Radiation Oncologist - Dr. Tyler Pita, [facility]   Medical Oncologist - Dr. Zola Button, [place]  Recommendations: 1) MRI/PET to  further evaluate cancer 2) Androgen deprivation (hormone injection)  3) TURP (trim prostate to help urination) 4) Radiation with Dr. Tammi Klippel  * These recommendations are based on information available as of today's consult.      Recommendations may change depending on the results of further tests or exams.  Next Steps: 1) Dr. Alan Ripper office will schedule scans and hormone injection after Medicaid approved  2) Dr. Alan Ripper office will schedule TURP 3) Dr. Tammi Klippel will schedule follow up for radiation  When appointments need to be scheduled, you will be contacted by [facility] and/or [facility].  Questions?  Please do not hesitate to call Cira Rue, RN, BSN, OCN at [phone] any questions or concerns.  Shirlean Mylar is your Oncology Nurse Navigator and is available to assist you while you're receiving your medical care at [facility].  Patient provided with business cards for all team members and a copy of "[facility] Pre[employer] Safety Sheet".

## 2017-03-09 NOTE — Consult Note (Signed)
Multi-Disciplinary Clinic 03/09/2017    Todd Holden         MRN: [id number]  PRIMARY CARE:    DOB: 1955/11/12, 61 year old Male  REFERRING:    SSN: [id number]  PROVIDER:  Franchot Gallo, M.D.    TREATING:  Raynelle Bring, M.D.    LOCATION:  [facility] [phone]    CC/HPI: CC: Prostate Cancer   Physician requesting consult: Dr. Franchot Gallo  PCP: None  Location of consult: [facility] - Prostate Cancer Multidisciplinary Clinic   Todd Holden is a 61 year old gentleman who presented to the hospital in late April 2018 with acute urinary retention, bilateral hydroureteronephrosis, and acute renal failure (Cr was 5.5). He was managed with urethral catheterization with improvement of his Cr to 1.9. His PSA at the time of his presentation was 75.65 and he was noted to have a concerning nodular prostate. His PSA was repeated as an outpatient and remained elevated at 66.9. He failed a voiding trial despite alpha blocker therapy and began CIC. He underwent a TRUS biopsy of the prostate on 02/05/17 that confirmed Gleason 4+4=8 adenocarcinoma with 11 out of 12 biopsy cores positive. Staging imaging revealed a solitary area of concern for a metastatic lesion on the right acetabulum on bone scan imaging.   Family history: Questionable history - brother   Imaging studies:  CT scan abdomen and pelvis (01/13/17) - No pelvic lymphadenopathy, bilateral hydroureteronephrosis  Bone scan (02/24/17)- Single area of concerning uptake on medial right acetabulum, hydronephrosis resolved   PMH: He has a history of hypertension and GERD.  PSH: No abdominal surgeries.   TNM stage: cT3a N0 M1  PSA: 66.9  Gleason score: 4+4=8  Biopsy (02/05/17): 11/12 cores positive  Left: L lateral apex (30%, 4+4=8), L apex (20%, 4+4=8), L lateral mid (50%, 4+4=8), L mid (70%, 4+4=8), L lateral base (90%, 4+4=8, PNI), L base (60%, 4+4=8, PNI)  Right: R apex (20%, 4+4=8), R lateral apex  (20%, 4+4=8, PNI), R mid (20%, 4+4=8), R lateral mid (30%, 4+4=8), R lateral base (95%, 4+4=8)  Prostate volume: 55.6 cc   Urinary function: He is in urinary retention and currently performing clean intermittent catheterization.  Erectile function: SHIM score is 16.     ALLERGIES: No Allergies    MEDICATIONS: Flomax 0.4 mg capsule, ext release 24 hr  Protonix     GU PSH: Prostate Needle Biopsy - 02/05/2017    NON-GU PSH: Surgical Pathology, Gross And Microscopic Examination For Prostate Needle - 02/05/2017 Tonsillectomy        GU PMH: Elevated PSA - 02/05/2017 Prostate nodule w/o LUTS - 02/05/2017, The patient has a very nodular prostate and a PSA, following Foley catheter placement as well as urinary retention, 75.65. Interestingly, he states that his brother was told that he had a very hard prostate, but biopsies were negative. His brother was treated for BPH.however, I'm strongly suspicious for prostate cancer., - 01/21/2017 Urinary Retention - 02/05/2017, He was unable to void today with his bladder full. He was taught self-catheterization., - 01/21/2017 Kidney Failure Unspec      PMH Notes: Bleeding disorder   NON-GU PMH: Arthritis GERD Hypertension    FAMILY HISTORY: Diabetes - Father Enlarged prostate - Brother Esophageal Cancer - Runs in Family Kidney Failure - Brother lupus - Mother Parkinson's Disease - Runs in Family   SOCIAL HISTORY: Marital Status: Divorced Current Smoking Status: Patient has never smoked.  <DIV'  Tobacco  Use Assessment Completed:  Used Tobacco in last 30 days?   Has never drank.  Drinks 4+ caffeinated drinks per day.    REVIEW OF SYSTEMS:     GU Review Male:  Patient denies frequent urination, hard to postpone urination, burning/ pain with urination, get up at night to urinate, leakage of urine, stream starts and stops, trouble starting your streams, and have to strain to urinate .    Gastrointestinal (Upper):  Patient denies nausea and vomiting.     Gastrointestinal (Lower):  Patient denies diarrhea and constipation.    Constitutional:  Patient denies fever, night sweats, weight loss, and fatigue.    Skin:  Patient denies skin rash/ lesion and itching.    Eyes:  Patient denies blurred vision and double vision.    Ears/ Nose/ Throat:  Patient denies sore throat and sinus problems.    Hematologic/Lymphatic:  Patient denies swollen glands and easy bruising.    Cardiovascular:  Patient denies leg swelling and chest pains.    Respiratory:  Patient denies cough and shortness of breath.    Endocrine:  Patient denies excessive thirst.    Musculoskeletal:  Patient denies back pain and joint pain.    Neurological:  Patient denies headaches and dizziness.    Psychologic:  Patient denies anxiety and depression.    VITAL SIGNS: None     MULTI-SYSTEM PHYSICAL EXAMINATION:      Constitutional: Well-nourished. No physical deformities. Normally developed. Good grooming.             PAST DATA REVIEWED:   Source Of History:  Patient  Lab Test Review:  PSA  Records Review:  Pathology Reports  X-Ray Review: C.T. Abdomen/Pelvis: Reviewed Films.  Bone Scan: Reviewed Films.      01/21/17  PSA  Total PSA 66.90     PROCEDURES: None   ASSESSMENT:     ICD-10 Details  1 GU:  Prostate Cancer - C61   2  Urinary Retention - R33.8    PLAN:   Medications   Letter(s):  Created for Patient: Clinical Summary   Notes:  1. Prostate cancer: He has been evaluated by both Dr. Alen Holden and Dr. Tammi Holden earlier this afternoon. We discussed the concern about metastatic disease based on his bone scan. It was agreed to proceeding with additional evaluation with an MRI of the pelvis might provide additional information about this lesion as no blastic lesion was noted on his CT scan. If this does continue to raise concern for metastatic disease, it also would likely be helpful to further quantify his burden of metastatic disease with more sensitive imaging such as  a sodium fluoride PET scan or Axumin PET. Dr. Tammi Holden would potentially offer Todd Holden radiation therapy even in the setting of oligo metastatic disease to the pelvis that he can also treat simultaneously.   In the meantime, it would be appropriate for Todd Holden to begin systemic therapy with androgen deprivation as he will require this whether he has metastatic disease or concomitantly with radiation therapy.   It would also be appropriate for Dr. Diona Holden to continue treating his urinary retention due to presumed bladder obstruction at this time with the idea of delaying radiation therapy, if indicated, until after he has healed for many potential procedures such as channel TURP.   I did not recommend primary surgical therapy for treatment of his prostate cancer considering that this would likely not be curative but would have the additional risk of incontinence.   The other  complicating factor is that Todd Holden currently does not have insurance coverage although is apparently in the process of obtaining Medicaid.   I will make Dr. Diona Holden aware of the recommendations from the multidisciplinary clinic.   Cc: Dr. Franchot Gallo  Dr. Zola Button  Dr. Tyler Pita   E & M CODE: I spent at least 40 minutes face to face with the patient, more than 50% of that time was spent on counseling and/or coordinating care.

## 2017-03-19 ENCOUNTER — Telehealth: Payer: Self-pay | Admitting: Medical Oncology

## 2017-03-19 NOTE — Telephone Encounter (Signed)
Called Todd Holden to follow up post Prostate [facility] 03/09/17. There was no answer or answering machine. I will try to reach patient again.

## 2017-03-25 ENCOUNTER — Other Ambulatory Visit: Payer: Self-pay | Admitting: Urology

## 2017-03-30 ENCOUNTER — Telehealth: Payer: Self-pay | Admitting: Medical Oncology

## 2017-03-30 NOTE — Telephone Encounter (Signed)
I left a message with Dr. Alan Ripper nurse to inform her he has called me requesting results of MRI and plans for Lupron and TURP. I asked her if she would leave me a message with an update.

## 2017-03-30 NOTE — Telephone Encounter (Signed)
I contacted [facility] to see if they have another number to reach patient. I have attempted to call him several times without success. They were able to provide me with a home number.  I  reached patient and  informed him that I have attempted to follow up with him post clinic but was unable to leave a message. He states  he did get  Medicaid approved and MRI done at [facility] but has not heard the results. He was started on Casodex and Proscar by Dr. Diona Fanti in June. I informed him this is treatment for his cancer and urinary issues. He is wondering about the Lupron injection and appointment for TURP. I informed him,  I will call Dr. Alan Ripper nurse and leave a message for her to check on these things. He voiced understanding of the above.

## 2017-03-30 NOTE — Telephone Encounter (Signed)
Attempted to return call to Mr. Horwitz but his phone is not set up with voice mail.

## 2017-03-30 NOTE — Telephone Encounter (Signed)
Mr. Imburgia called leaving a message that he has not heard from anyone since the Prostate [place]. He has requested a return call.

## 2017-04-02 ENCOUNTER — Encounter: Payer: Self-pay | Admitting: *Deleted

## 2017-04-15 DIAGNOSIS — C61 Malignant neoplasm of prostate: Secondary | ICD-10-CM | POA: Diagnosis not present

## 2017-04-15 DIAGNOSIS — C7951 Secondary malignant neoplasm of bone: Secondary | ICD-10-CM | POA: Diagnosis not present

## 2017-04-15 DIAGNOSIS — N1330 Unspecified hydronephrosis: Secondary | ICD-10-CM | POA: Diagnosis not present

## 2017-04-15 DIAGNOSIS — Z87891 Personal history of nicotine dependence: Secondary | ICD-10-CM | POA: Diagnosis not present

## 2017-04-15 DIAGNOSIS — R599 Enlarged lymph nodes, unspecified: Secondary | ICD-10-CM | POA: Diagnosis not present

## 2017-04-26 ENCOUNTER — Other Ambulatory Visit: Payer: Self-pay | Admitting: Urology

## 2017-04-27 ENCOUNTER — Encounter (HOSPITAL_BASED_OUTPATIENT_CLINIC_OR_DEPARTMENT_OTHER): Payer: Self-pay | Admitting: *Deleted

## 2017-04-27 NOTE — Progress Notes (Signed)
To [facility] [place] on arrival.Npo after Mn-will take protonix with small amt water in am.23 hour observation planned.

## 2017-04-28 ENCOUNTER — Encounter: Payer: Self-pay | Admitting: Urology

## 2017-04-28 NOTE — Progress Notes (Signed)
Patient was started on Casodex and Proscar by Dr. Diona Fanti in June and 4 mth Lupron given 04/01/17 with plans to continue indefinitely.  Had MRI pelvis in [place] which confirmed a metastatic lesion in the pelvis.  He is scheduled for TURP on 05/03/17.   [facility] is arranging for a follow up with Dr. Orlene Erm in [place] to consider XRT to oligometastatic deposit in pelvis.

## 2017-05-03 ENCOUNTER — Ambulatory Visit (HOSPITAL_BASED_OUTPATIENT_CLINIC_OR_DEPARTMENT_OTHER): Payer: Medicaid Other | Admitting: Anesthesiology

## 2017-05-03 ENCOUNTER — Encounter (HOSPITAL_BASED_OUTPATIENT_CLINIC_OR_DEPARTMENT_OTHER): Payer: Self-pay

## 2017-05-03 ENCOUNTER — Ambulatory Visit (HOSPITAL_BASED_OUTPATIENT_CLINIC_OR_DEPARTMENT_OTHER)
Admission: RE | Admit: 2017-05-03 | Discharge: 2017-05-04 | Disposition: A | Discharge status: Home / Self Care | Payer: Medicaid Other | Source: Ambulatory Visit | Attending: Urology | Admitting: Urology

## 2017-05-03 ENCOUNTER — Other Ambulatory Visit: Payer: Self-pay

## 2017-05-03 ENCOUNTER — Encounter (HOSPITAL_BASED_OUTPATIENT_CLINIC_OR_DEPARTMENT_OTHER)
Admission: RE | Disposition: A | Discharge status: Home / Self Care | Payer: Self-pay | Source: Ambulatory Visit | Attending: Urology

## 2017-05-03 DIAGNOSIS — Z87891 Personal history of nicotine dependence: Secondary | ICD-10-CM | POA: Diagnosis not present

## 2017-05-03 DIAGNOSIS — K219 Gastro-esophageal reflux disease without esophagitis: Secondary | ICD-10-CM | POA: Insufficient documentation

## 2017-05-03 DIAGNOSIS — C61 Malignant neoplasm of prostate: Secondary | ICD-10-CM | POA: Diagnosis present

## 2017-05-03 DIAGNOSIS — Z79899 Other long term (current) drug therapy: Secondary | ICD-10-CM | POA: Diagnosis not present

## 2017-05-03 DIAGNOSIS — N401 Enlarged prostate with lower urinary tract symptoms: Secondary | ICD-10-CM | POA: Diagnosis not present

## 2017-05-03 DIAGNOSIS — I10 Essential (primary) hypertension: Secondary | ICD-10-CM | POA: Diagnosis not present

## 2017-05-03 DIAGNOSIS — N138 Other obstructive and reflux uropathy: Secondary | ICD-10-CM | POA: Diagnosis not present

## 2017-05-03 HISTORY — DX: Malignant (primary) neoplasm, unspecified: C80.1

## 2017-05-03 HISTORY — DX: Gastrointestinal hemorrhage, unspecified: K92.2

## 2017-05-03 HISTORY — PX: TRANSURETHRAL RESECTION OF PROSTATE: SHX73

## 2017-05-03 HISTORY — DX: Gastro-esophageal reflux disease without esophagitis: K21.9

## 2017-05-03 LAB — POCT I-STAT, CHEM 8
BUN: 23 mg/dL — AB (ref 6–20)
CHLORIDE: 106 mmol/L (ref 101–111)
CREATININE: 1.2 mg/dL (ref 0.61–1.24)
Calcium, Ion: 1.21 mmol/L (ref 1.15–1.40)
Glucose, Bld: 109 mg/dL — ABNORMAL HIGH (ref 65–99)
HEMATOCRIT: 45 % (ref 39.0–52.0)
Hemoglobin: 15.3 g/dL (ref 13.0–17.0)
POTASSIUM: 4.5 mmol/L (ref 3.5–5.1)
Sodium: 141 mmol/L (ref 135–145)
TCO2: 25 mmol/L (ref 0–100)

## 2017-05-03 SURGERY — TURP (TRANSURETHRAL RESECTION OF PROSTATE)
Anesthesia: General | Site: Prostate

## 2017-05-03 MED ORDER — DEXTROSE 5 % IV SOLN
1.0000 g | INTRAVENOUS | Status: AC
  Administered 2017-05-03: 2 g via INTRAVENOUS
  Filled 2017-05-03: qty 10

## 2017-05-03 MED ORDER — FENTANYL CITRATE (PF) 100 MCG/2ML IJ SOLN
INTRAMUSCULAR | Status: AC
  Filled 2017-05-03: qty 2

## 2017-05-03 MED ORDER — HYDROMORPHONE HCL-NACL 0.5-0.9 MG/ML-% IV SOSY
PREFILLED_SYRINGE | INTRAVENOUS | Status: AC
  Filled 2017-05-03: qty 1

## 2017-05-03 MED ORDER — BELLADONNA ALKALOIDS-OPIUM 16.2-60 MG RE SUPP
1.0000 | Freq: Four times a day (QID) | RECTAL | Status: DC | PRN
  Filled 2017-05-03: qty 1

## 2017-05-03 MED ORDER — ONDANSETRON HCL 4 MG/2ML IJ SOLN
4.0000 mg | INTRAMUSCULAR | Status: DC | PRN
  Filled 2017-05-03: qty 2

## 2017-05-03 MED ORDER — HYDROCODONE-ACETAMINOPHEN 5-325 MG PO TABS
ORAL_TABLET | ORAL | Status: AC
  Filled 2017-05-03: qty 1

## 2017-05-03 MED ORDER — PROPOFOL 10 MG/ML IV BOLUS
INTRAVENOUS | Status: AC
Start: 2017-05-03 — End: 2017-05-03
  Filled 2017-05-03: qty 40

## 2017-05-03 MED ORDER — CEFTRIAXONE SODIUM 1 G IJ SOLR
INTRAMUSCULAR | Status: AC
  Filled 2017-05-03: qty 10

## 2017-05-03 MED ORDER — LIDOCAINE 2% (20 MG/ML) 5 ML SYRINGE
INTRAMUSCULAR | Status: DC | PRN
  Administered 2017-05-03: 100 mg via INTRAVENOUS

## 2017-05-03 MED ORDER — MIDAZOLAM HCL 2 MG/2ML IJ SOLN
INTRAMUSCULAR | Status: AC
  Filled 2017-05-03: qty 2

## 2017-05-03 MED ORDER — FENTANYL CITRATE (PF) 100 MCG/2ML IJ SOLN
INTRAMUSCULAR | Status: DC | PRN
  Administered 2017-05-03: 50 ug via INTRAVENOUS
  Administered 2017-05-03 (×2): 25 ug via INTRAVENOUS

## 2017-05-03 MED ORDER — LIDOCAINE 2% (20 MG/ML) 5 ML SYRINGE
INTRAMUSCULAR | Status: AC
  Filled 2017-05-03: qty 5

## 2017-05-03 MED ORDER — MIDAZOLAM HCL 5 MG/5ML IJ SOLN
INTRAMUSCULAR | Status: DC | PRN
  Administered 2017-05-03: 2 mg via INTRAVENOUS

## 2017-05-03 MED ORDER — SODIUM CHLORIDE 0.45 % IV SOLN
INTRAVENOUS | Status: DC
  Administered 2017-05-03 – 2017-05-04 (×2): via INTRAVENOUS
  Filled 2017-05-03 (×6): qty 1000

## 2017-05-03 MED ORDER — SODIUM CHLORIDE 0.9 % IR SOLN
Status: DC | PRN
  Administered 2017-05-03 (×3): 3000 mL via INTRAVESICAL

## 2017-05-03 MED ORDER — ZOLPIDEM TARTRATE 5 MG PO TABS
5.0000 mg | ORAL_TABLET | Freq: Every evening | ORAL | Status: DC | PRN
  Administered 2017-05-04: 5 mg via ORAL
  Filled 2017-05-03: qty 1

## 2017-05-03 MED ORDER — PANTOPRAZOLE SODIUM 40 MG PO TBEC
40.0000 mg | DELAYED_RELEASE_TABLET | Freq: Every day | ORAL | Status: DC
  Filled 2017-05-03: qty 1

## 2017-05-03 MED ORDER — ONDANSETRON HCL 4 MG/2ML IJ SOLN
INTRAMUSCULAR | Status: DC | PRN
  Administered 2017-05-03: 4 mg via INTRAVENOUS

## 2017-05-03 MED ORDER — HYDROMORPHONE HCL 1 MG/ML IJ SOLN
0.2500 mg | INTRAMUSCULAR | Status: DC | PRN
  Administered 2017-05-03 (×2): 0.25 mg via INTRAVENOUS
  Administered 2017-05-03: 0.5 mg via INTRAVENOUS
  Filled 2017-05-03: qty 0.5

## 2017-05-03 MED ORDER — SODIUM CHLORIDE 0.9 % IV SOLN
INTRAVENOUS | Status: DC
  Administered 2017-05-03: 09:00:00 via INTRAVENOUS
  Filled 2017-05-03: qty 1000

## 2017-05-03 MED ORDER — SULFAMETHOXAZOLE-TRIMETHOPRIM 800-160 MG PO TABS: 1.0000 | ORAL_TABLET | Freq: Two times a day (BID) | ORAL | 0 refills | Status: AC

## 2017-05-03 MED ORDER — SODIUM CHLORIDE 0.9 % IR SOLN
3000.0000 mL | Status: DC
  Administered 2017-05-03 (×2): 3000 mL
  Filled 2017-05-03: qty 3000

## 2017-05-03 MED ORDER — HYDROCODONE-ACETAMINOPHEN 5-325 MG PO TABS
1.0000 | ORAL_TABLET | ORAL | Status: DC | PRN
  Administered 2017-05-03 (×2): 1 via ORAL
  Filled 2017-05-03: qty 2

## 2017-05-03 MED ORDER — BICALUTAMIDE 50 MG PO TABS
50.0000 mg | ORAL_TABLET | Freq: Every evening | ORAL | Status: DC
  Filled 2017-05-03: qty 1

## 2017-05-03 MED ORDER — DEXAMETHASONE SODIUM PHOSPHATE 10 MG/ML IJ SOLN
INTRAMUSCULAR | Status: AC
  Filled 2017-05-03: qty 1

## 2017-05-03 MED ORDER — DEXAMETHASONE SODIUM PHOSPHATE 10 MG/ML IJ SOLN
INTRAMUSCULAR | Status: DC | PRN
  Administered 2017-05-03: 10 mg via INTRAVENOUS

## 2017-05-03 MED ORDER — ACETAMINOPHEN 325 MG PO TABS
650.0000 mg | ORAL_TABLET | ORAL | Status: DC | PRN
  Filled 2017-05-03: qty 2

## 2017-05-03 MED ORDER — SULFAMETHOXAZOLE-TRIMETHOPRIM 800-160 MG PO TABS
1.0000 | ORAL_TABLET | Freq: Two times a day (BID) | ORAL | Status: DC
  Administered 2017-05-03: 1 via ORAL
  Filled 2017-05-03: qty 1

## 2017-05-03 MED ORDER — ONDANSETRON HCL 4 MG/2ML IJ SOLN
INTRAMUSCULAR | Status: AC
  Filled 2017-05-03: qty 2

## 2017-05-03 MED ORDER — SENNA 8.6 MG PO TABS
1.0000 | ORAL_TABLET | Freq: Two times a day (BID) | ORAL | Status: DC
  Administered 2017-05-03: 8.6 mg via ORAL
  Filled 2017-05-03: qty 1

## 2017-05-03 MED ORDER — PROPOFOL 10 MG/ML IV BOLUS
INTRAVENOUS | Status: DC | PRN
  Administered 2017-05-03: 200 mg via INTRAVENOUS

## 2017-05-03 MED ORDER — DEXTROSE 5 % IV SOLN
INTRAVENOUS | Status: AC
  Filled 2017-05-03: qty 50

## 2017-05-03 SURGICAL SUPPLY — 20 items
BAG DRAIN URO-CYSTO SKYTR STRL (DRAIN) ×2 IMPLANT
BAG URINE DRAINAGE (UROLOGICAL SUPPLIES) ×2 IMPLANT
CATH HEMA 3WAY 30CC 22FR COUDE (CATHETERS) ×2 IMPLANT
CLOTH BEACON ORANGE TIMEOUT ST (SAFETY) ×2 IMPLANT
ELECT REM PT RETURN 9FT ADLT (ELECTROSURGICAL) ×2
ELECTRODE REM PT RTRN 9FT ADLT (ELECTROSURGICAL) ×1 IMPLANT
EVACUATOR MICROVAS BLADDER (UROLOGICAL SUPPLIES) ×2 IMPLANT
GLOVE BIO SURGEON STRL SZ8 (GLOVE) ×2 IMPLANT
GOWN STRL REUS W/ TWL XL LVL3 (GOWN DISPOSABLE) ×2 IMPLANT
GOWN STRL REUS W/TWL XL LVL3 (GOWN DISPOSABLE) ×2
HOLDER FOLEY CATH W/STRAP (MISCELLANEOUS) ×2 IMPLANT
IV NS IRRIG 3000ML ARTHROMATIC (IV SOLUTION) ×6 IMPLANT
KIT RM TURNOVER CYSTO AR (KITS) ×2 IMPLANT
LOOP CUT BIPOLAR 24F LRG (ELECTROSURGICAL) ×2 IMPLANT
MANIFOLD NEPTUNE II (INSTRUMENTS) ×2 IMPLANT
NS IRRIG 500ML POUR BTL (IV SOLUTION) ×2 IMPLANT
PACK CYSTO (CUSTOM PROCEDURE TRAY) ×2 IMPLANT
SYR 30ML LL (SYRINGE) ×2 IMPLANT
TUBE CONNECTING 12X1/4 (SUCTIONS) ×2 IMPLANT
WATER STERILE IRR 500ML POUR (IV SOLUTION) ×2 IMPLANT

## 2017-05-03 NOTE — Anesthesia Procedure Notes (Signed)
Procedure Name: LMA Insertion Date/Time: 05/03/2017 9:25 AM Performed by: Bethena Roys T Pre-anesthesia Checklist: Patient identified, Emergency Drugs available, Suction available and Patient being monitored Patient Re-evaluated:Patient Re-evaluated prior to induction Oxygen Delivery Method: Circle system utilized Preoxygenation: Pre-oxygenation with 100% oxygen Induction Type: IV induction Ventilation: Mask ventilation without difficulty LMA: LMA inserted LMA Size: 5.0 Number of attempts: 1 Airway Equipment and Method: Bite block Placement Confirmation: positive ETCO2 Tube secured with: Tape Dental Injury: Teeth and Oropharynx as per pre-operative assessment

## 2017-05-03 NOTE — Anesthesia Preprocedure Evaluation (Signed)
Anesthesia Evaluation  Patient identified by MRN, date of birth, ID band Patient awake    Reviewed: Allergy & Precautions, NPO status , Patient's Chart, lab work & pertinent test results  Airway Mallampati: II  TM Distance: >3 FB     Dental  (+) Dental Advisory Given, Poor Dentition, Loose   Pulmonary former smoker,    breath sounds clear to auscultation       Cardiovascular hypertension,  Rhythm:Regular Rate:Normal     Neuro/Psych    GI/Hepatic Neg liver ROS, GERD  ,  Endo/Other  negative endocrine ROS  Renal/GU Renal disease     Musculoskeletal   Abdominal   Peds  Hematology   Anesthesia Other Findings   Reproductive/Obstetrics                             Anesthesia Physical Anesthesia Plan  ASA: III  Anesthesia Plan: General   Post-op Pain Management:    Induction: Intravenous  PONV Risk Score and Plan: 2 and Ondansetron, Dexamethasone, Propofol infusion and Midazolam  Airway Management Planned: LMA  Additional Equipment:   Intra-op Plan:   Post-operative Plan: Extubation in OR  Informed Consent: I have reviewed the patients History and Physical, chart, labs and discussed the procedure including the risks, benefits and alternatives for the proposed anesthesia with the patient or authorized representative who has indicated his/her understanding and acceptance.   Dental advisory given  Plan Discussed with: CRNA and Anesthesiologist  Anesthesia Plan Comments:         Anesthesia Quick Evaluation

## 2017-05-03 NOTE — Discharge Instructions (Signed)
Transurethral Resection of the Prostate ° °Care After ° °Refer to this sheet in the next few weeks. These discharge instructions provide you with general information on caring for yourself after you leave the hospital. Your caregiver may also give you specific instructions. Your treatment has been planned according to the most current medical practices available, but unavoidable complications sometimes occur. If you have any problems or questions after discharge, please call your caregiver. ° °HOME CARE INSTRUCTIONS  ° °Medications °· You may receive medicine for pain management. As your level of discomfort decreases, adjustments in your pain medicines may be made.  °· Take all medicines as directed.  °· You may be given a medicine (antibiotic) to kill germs following surgery. Finish all medicines. Let your caregiver know if you have any side effects or problems from the medicine.  °· If you are on aspirin, it would be best not to restart the aspirin until the blood in the urine clears °Hygiene °· You can take a shower after surgery.  °· You should not take a bath while you still have the urethral catheter. °Activity °· You will be encouraged to get out of bed as much as possible and increase your activity level as tolerated.  °· Spend the first week in and around your home. For 3 weeks, avoid the following:  °· Straining.  °· Running.  °· Strenuous work.  °· Walks longer than a few blocks.  °· Riding for extended periods.  °· Sexual relations.  °· Do not lift heavy objects (more than 20 pounds) for at least 1 month. When lifting, use your arms instead of your abdominal muscles.  °· You will be encouraged to walk as tolerated. Do not exert yourself. Increase your activity level slowly. Remember that it is important to keep moving after an operation of any type. This cuts down on the possibility of developing blood clots.  °· Your caregiver will tell you when you can resume driving and light housework. Discuss this  at your first office visit after discharge. °Diet °· No special diet is ordered after a TURP. However, if you are on a special diet for another medical problem, it should be continued.  °· Normal fluid intake is usually recommended.  °· Avoid alcohol and caffeinated drinks for 2 weeks. They irritate the bladder. Decaffeinated drinks are okay.  °· Avoid spicy foods.  °Bladder Function °· For the first 10 days, empty the bladder whenever you feel a definite desire. Do not try to hold the urine for long periods of time.  °· Urinating once or twice a night even after you are healed is not uncommon.  °· You may see some recurrence of blood in the urine after discharge from the hospital. This usually happens within 2 weeks after the procedure.If this occurs, force fluids again as you did in the hospital and reduce your activity.  °Bowel Function °· You may experience some constipation after surgery. This can be minimized by increasing fluids and fiber in your diet. Drink enough water and fluids to keep your urine clear or pale yellow.  °· A stool softener may be prescribed for use at home. Do not strain to move your bowels.  °· If you are requiring increased pain medicine, it is important that you take stool softeners to prevent constipation. This will help to promote proper healing by reducing the need to strain to move your bowels.  °Sexual Activity °· Semen movement in the opposite direction and into the bladder (  retrograde ejaculation) may occur. Since the semen passes into the bladder, cloudy urine can occur the first time you urinate after intercourse. Or, you may not have an ejaculation during erection. Ask your caregiver when you can resume sexual activity. Retrograde ejaculation and reduced semen discharge should not reduce one's pleasure of intercourse.  °Postoperative Visit °· Arrange the date and time of your after surgery visit with your caregiver.  °Return to Work °· After your recovery is complete, you will  be able to return to work and resume all activities. Your caregiver will inform you when you can return to work.  °Foley Catheter Care °A soft, flexible tube (Foley catheter) may have been placed in your bladder to drain urine and fluid. Follow these instructions: °Taking Care of the Catheter °· Keep the area where the catheter leaves your body clean.  °· Attach the catheter to the leg so there is no tension on the catheter.  °· Keep the drainage bag below the level of the bladder, but keep it OFF the floor.  °· Do not take long soaking baths. Your caregiver will give instructions about showering.  °· Wash your hands before touching ANYTHING related to the catheter or bag.  °· Using mild soap and warm water on a washcloth:  °· Clean the area closest to the catheter insertion site using a circular motion around the catheter.  °· Clean the catheter itself by wiping AWAY from the insertion site for several inches down the tube.  °· NEVER wipe upward as this could sweep bacteria up into the urethra (tube in your body that normally drains the bladder) and cause infection.  °· Place a small amount of sterile lubricant at the tip of the penis where the catheter is entering.  °Taking Care of the Drainage Bags °· Two drainage bags may be taken home: a large overnight drainage bag, and a smaller leg bag which fits underneath clothing.  °· It is okay to wear the overnight bag at any time, but NEVER wear the smaller leg bag at night.  °· Keep the drainage bag well below the level of your bladder. This prevents backflow of urine into the bladder and allows the urine to drain freely.  °· Anchor the tubing to your leg to prevent pulling or tension on the catheter. Use tape or a leg strap provided by the hospital.  °· Empty the drainage bag when it is 1/2 to 3/4 full. Wash your hands before and after touching the bag.  °· Periodically check the tubing for kinks to make sure there is no pressure on the tubing which could restrict  the flow of urine.  °Changing the Drainage Bags °· Cleanse both ends of the clean bag with alcohol before changing.  °· Pinch off the rubber catheter to avoid urine spillage during the disconnection.  °· Disconnect the dirty bag and connect the clean one.  °· Empty the dirty bag carefully to avoid a urine spill.  °· Attach the new bag to the leg with tape or a leg strap.  °Cleaning the Drainage Bags °· Whenever a drainage bag is disconnected, it must be cleaned quickly so it is ready for the next use.  °· Wash the bag in warm, soapy water.  °· Rinse the bag thoroughly with warm water.  °· Soak the bag for 30 minutes in a solution of white vinegar and water (1 cup vinegar to 1 quart warm water).  °· Rinse with warm water.  °SEEK MEDICAL   CARE IF:  °· You have chills or night sweats.  °· You are leaking around your catheter or have problems with your catheter. It is not uncommon to have sporadic leakage around your catheter as a result of bladder spasms. If the leakage stops, there is not much need for concern. If you are uncertain, call your caregiver.  °· You develop side effects that you think are coming from your medicines.  °SEEK IMMEDIATE MEDICAL CARE IF:  °· You are suddenly unable to urinate. Check to see if there are any kinks in the drainage tubing that may cause this. If you cannot find any kinks, call your caregiver immediately. This is an emergency.  °· You develop shortness of breath or chest pains.  °· Bleeding persists or clots develop in your urine.  °· You have a fever.  °· You develop pain in your back or over your lower belly (abdomen).  °· You develop pain or swelling in your legs.  °· Any problems you are having get worse rather than better.  °MAKE SURE YOU:  °· Understand these instructions.  °· Will watch your condition.  °· Will get help right away if you are not doing well or get worse.  °Document Released: 08/31/2005 Document Revised: 05/13/2011 Document Reviewed: 04/24/2009 °[employer]®  Patient Information ©2012 [employer].Transurethral Resection of the Prostate °Care After °Refer to this sheet in the next few weeks. These discharge instructions provide you with general information on caring for yourself after you leave the hospital. Your caregiver may also give you specific instructions. Your treatment has been planned according to the most current medical practices available, but unavoidable complications sometimes occur. If you have any problems or questions after discharge, please call your caregiver. °

## 2017-05-03 NOTE — Anesthesia Postprocedure Evaluation (Signed)
Anesthesia Post Note  Patient: Todd Holden  Procedure(s) Performed: Procedure(s) (LRB): TRANSURETHRAL RESECTION OF THE PROSTATE (TURP) (N/A)     Patient location during evaluation: PACU Anesthesia Type: General Level of consciousness: awake Pain management: pain level controlled Vital Signs Assessment: post-procedure vital signs reviewed and stable Respiratory status: spontaneous breathing Cardiovascular status: stable Anesthetic complications: no    Last Vitals:  Vitals:   05/03/17 1200 05/03/17 1215  BP: 137/77 136/72  Pulse: (!) 49 (!) 51  Resp: 10 10  Temp: 36.4 C   SpO2: 96% 97%    Last Pain:  Vitals:   05/03/17 1200  TempSrc:   PainSc: 1                  [name]

## 2017-05-03 NOTE — H&P (Signed)
H&P  Chief Complaint: Inability to urinate  History of Present Illness: 61year old male presents for TURP. He was recently diagnosed with PCa after presentation with BOO and subsequent bilateral hydro w/ ARF. His ARF/hydro has resolved with bladder drainage, now being done with SIC. TRUS/Bx of his prostate 02/05/2017 revealed high grade prostate cancer (PSA 66). HE has 1 area of bony metastatic disease. HE has been started on ADT and will be undergoing EBRT. He presents now for TURP.  Past Medical History:  Diagnosis Date  . ARF (acute renal failure) ([place])   . Cancer ([place]) 02/2017   metastatic prostate ca  . GERD (gastroesophageal reflux disease)    daily protonix  . Hydronephrosis   . Hypertension    no medications  . [place] grade D esophagitis   . Prostate enlargement 2008  . Upper GI bleed 01/09/2017    Past Surgical History:  Procedure Laterality Date  . ESOPHAGOGASTRODUODENOSCOPY N/A 01/09/2017   Procedure: ESOPHAGOGASTRODUODENOSCOPY (EGD);  Surgeon: Carol Ada, MD;  Location[facility] ENDOSCOPY;  Service: Endoscopy;  Laterality: N/A;  . TONSILLECTOMY     61 years old    Home Medications:  Allergies as of 05/03/2017   No Known Allergies     Medication List    Notice   Cannot display discharge medications because the patient has not yet been admitted.     Allergies: No Known Allergies  Family History  Problem Relation Age of Onset  . Diabetes Father   . Parkinson's disease Father     Social History:  reports that he quit smoking about 40 years ago. His smoking use included Cigarettes. He smoked 1.00 pack per day. He has never used smokeless tobacco. He reports that he uses drugs, including Marijuana. He reports that he does not drink alcohol.  ROS: A complete review of systems was performed.  All systems are negative except for pertinent findings as noted.  Physical Exam:  Vital signs in last 24 hours:   Constitutional:  Alert and oriented, No acute  distress Cardiovascular: Regular rate and rhythm, No JVD Respiratory: Normal respiratory effort, Lungs clear bilaterally GI: Abdomen is soft, nontender, nondistended, no abdominal masses Genitourinary: No CVAT. Normal male phallus, testes are descended bilaterally and non-tender and without masses, scrotum is normal in appearance without lesions or masses, perineum is normal on inspection. Rectal: Normal sphincter tone, no rectal masses, prostate is significantly nodular.. Prostate size is estimated to be 60 cc Lymphatic: No lymphadenopathy Neurologic: Grossly intact, no focal deficits Psychiatric: Normal mood and affect  Laboratory Data:  No results for input(s): WBC, HGB, HCT, PLT in the last 72 hours.  No results for input(s): NA, K, CL, GLUCOSE, BUN, CALCIUM, CREATININE in the last 72 hours.  Invalid input(s): CO3   No results found for this or any previous visit (from the past 24 hour(s)). No results found for this or any previous visit (from the past 240 hour(s)).  Renal Function: No results for input(s): CREATININE in the last 168 hours. CrCl cannot be calculated (Patient's most recent lab result is older than the maximum 21 days allowed.).  Radiologic Imaging: No results found.  Impression/Assessment:  PCa w/ BOO--pt dependant on SIC  Plan:  TURP

## 2017-05-03 NOTE — Op Note (Signed)
Preoperative diagnosis: 1. Bladder outlet obstruction secondary to BPH 2. Prostate cancer  Postoperative diagnosis:  1. Bladder outlet obstruction secondary to BPH 2. Prostate cancer  Procedure:  1. Cystoscopy 2. Channel transurethral resection of the prostate  Surgeon: Lillette Boxer. Edin Skarda, M.D.  Anesthesia: General  Complications: None  Drain: 22 fr hematuria 3 way catheter catheter  EBL: Minimal  Specimens: 1. Prostate chips  Disposition of specimens: Pathology  Indication: Todd Holden is a patient with bladder outlet obstruction secondary to benign prostatic hyperplasia/prostate cancer. He is on self-catheterization. After reviewing the management options for treatment, he elected to proceed with the above surgical procedure(s). We have discussed the potential benefits and risks of the procedure, side effects of the proposed treatment, the likelihood of the patient achieving the goals of the procedure, and any potential problems that might occur during the procedure or recuperation. Informed consent has been obtained.  Description of procedure:  The patient was identified in the holding area.He received preoperative antibiotics. He was then taken to the operating room. General anesthetic was administered.  The patient was then placed in the dorsal lithotomy position, prepped and draped in the usual sterile fashion. Timeout was then performed.  A resectoscope sheath was placed using the obturator, and the resectoscope, loop and telescope were placed.  The bladder was then systematically examined in its entirety. There was no evidence of  tumors, stones, or other mucosal pathology.  The ureteral orifices were identified and marked so as to be avoided during the procedure.  The prostate adenoma was then resected utilizing loop cautery resection with the monopolar/bipolar cutting loop.  The prostate adenoma from the bladder neck back to the verumontanum was resected  beginning at the six o'clock position and then extended to include the right and left lobes of the prostate and anterior prostate, respectively. Care was taken not to resect distal to the verumontanum.  Hemostasis was then achieved with the cautery and the bladder was emptied and reinspected with no significant bleeding noted at the end of the procedure.  Resected chips were irrigated from the bladder with the evacuator and sent to pathology.  A 3 way catheter was then placed into the bladder and placed on continuous bladder irrigation.  The patient appeared to tolerate the procedure well and without complications. The patient was able to be awakened and transferred to the recovery unit in satisfactory condition. He tolerated the procedure well.

## 2017-05-03 NOTE — Transfer of Care (Signed)
Immediate Anesthesia Transfer of Care Note  Patient: Todd Holden  Procedure(s) Performed: Procedure(s): TRANSURETHRAL RESECTION OF THE PROSTATE (TURP) (N/A)  Patient Location: PACU  Anesthesia Type:General  Level of Consciousness: awake, alert  and oriented  Airway & Oxygen Therapy: Patient Spontanous Breathing and Patient connected to nasal cannula oxygen  Post-op Assessment: Report given to RN  Post vital signs: Reviewed and stable  Last Vitals:  Vitals:   05/03/17 0738 05/03/17 1010  BP: (!) 144/67 (!) (P) 145/78  Pulse: (!) 55 (P) 60  Resp: 16 (P) 10  Temp: 36.4 C (!) (P) 36.4 C  SpO2: 97% (P) 100%    Last Pain:  Vitals:   05/03/17 0759  TempSrc:   PainSc: 2       Patients Stated Pain Goal: 3 (39/67/28 9791)  Complications: No apparent anesthesia complications

## 2017-05-03 NOTE — Interval H&P Note (Signed)
History and Physical Interval Note:  05/03/2017 9:15 AM  Todd Holden  has presented today for surgery, with the diagnosis of URINARY RETENTION, PROSTATE CANCER  The various methods of treatment have been discussed with the patient and family. After consideration of risks, benefits and other options for treatment, the patient has consented to  Procedure(s): TRANSURETHRAL RESECTION OF THE PROSTATE (TURP) (N/A) as a surgical intervention .  The patient's history has been reviewed, patient examined, no change in status, stable for surgery.  I have reviewed the patient's chart and labs.  Questions were answered to the patient's satisfaction.     [name]

## 2017-05-04 DIAGNOSIS — N401 Enlarged prostate with lower urinary tract symptoms: Secondary | ICD-10-CM | POA: Diagnosis not present

## 2017-05-04 MED ORDER — HYDROMORPHONE HCL-NACL 0.5-0.9 MG/ML-% IV SOSY
PREFILLED_SYRINGE | INTRAVENOUS | Status: AC
  Filled 2017-05-04: qty 1

## 2017-05-04 MED ORDER — ZOLPIDEM TARTRATE 5 MG PO TABS
ORAL_TABLET | ORAL | Status: AC
  Filled 2017-05-04: qty 1

## 2017-05-05 ENCOUNTER — Encounter (HOSPITAL_BASED_OUTPATIENT_CLINIC_OR_DEPARTMENT_OTHER): Payer: Self-pay | Admitting: Urology

## 2017-05-05 NOTE — Discharge Summary (Signed)
Physician Discharge Summary  Patient ID: Todd Holden MRN: [id number] DOB/AGE: 61/19/1957 61 y.o.  Admit date: 05/03/2017 Discharge date: 05/05/2017  Admission Diagnoses:  Malignant neoplasm of prostate [facility])  Discharge Diagnoses:  Principal Problem:   Malignant neoplasm of prostate [facility]) Active Problems:   Enlarged prostate with urinary obstruction   Past Medical History:  Diagnosis Date  . ARF (acute renal failure) ([place])   . Cancer ([place]) 02/2017   metastatic prostate ca  . GERD (gastroesophageal reflux disease)    daily protonix  . Hydronephrosis   . Hypertension    no medications  . [place] grade D esophagitis   . Prostate enlargement 2008  . Upper GI bleed 01/09/2017    Surgeries: Procedure(s): TRANSURETHRAL RESECTION OF THE PROSTATE (TURP) on 05/03/2017   Consultants (if any):   Discharged Condition: Improved  Hospital Course: Todd Holden is an 61 y.o. male who was admitted 05/03/2017 with a diagnosis of Malignant neoplasm of prostate ([place]) and went to the operating room on 05/03/2017 and underwent the above named procedures.  His foley was removed on 8/21 and he was able to void adequately and was discharged home.   He was given perioperative antibiotics:  Anti-infectives    Start     Dose/Rate Route Frequency Ordered Stop   05/03/17 1315  sulfamethoxazole-trimethoprim (BACTRIM DS,SEPTRA DS) 800-160 MG per tablet 1 tablet  Status:  Discontinued     1 tablet Oral Every 12 hours 05/03/17 1301 05/04/17 1412   05/03/17 0831  cefTRIAXone (ROCEPHIN) 1 g in dextrose 5 % 50 mL IVPB     1 g 100 mL/hr over 30 Minutes Intravenous 30 min pre-op 05/03/17 0831 05/03/17 0913   05/03/17 0000  sulfamethoxazole-trimethoprim (BACTRIM DS,SEPTRA DS) 800-160 MG tablet     1 tablet Oral 2 times daily 05/03/17 1007      .  He was given sequential compression devices, for DVT prophylaxis.  He benefited maximally from the hospital stay and there were no complications.     Recent vital signs:  Vitals:   05/04/17 0445 05/04/17 0757  BP: 133/62 125/69  Pulse: 62 66  Resp: 16 17  Temp: 97.7 F (36.5 C) 97.6 F (36.4 C)  SpO2: 98% 99%    Recent laboratory studies:  Lab Results  Component Value Date   HGB 15.3 05/03/2017   HGB 10.1 (L) 01/14/2017   HGB 10.9 (L) 01/13/2017   Lab Results  Component Value Date   WBC 9.3 01/14/2017   PLT 260 01/14/2017   Lab Results  Component Value Date   INR 1.06 01/08/2017   Lab Results  Component Value Date   NA 141 05/03/2017   K 4.5 05/03/2017   CL 106 05/03/2017   CO2 28 01/14/2017   BUN 23 (H) 05/03/2017   CREATININE 1.20 05/03/2017   GLUCOSE 109 (H) 05/03/2017    Discharge Medications:   Allergies as of 05/04/2017   No Known Allergies     Medication List    STOP taking these medications   finasteride 5 MG tablet Commonly known as:  PROSCAR     TAKE these medications   bicalutamide 50 MG tablet Commonly known as:  CASODEX Take 50 mg by mouth every evening.   pantoprazole 40 MG tablet Commonly known as:  PROTONIX Take 1 tablet (40 mg total) by mouth daily before breakfast.   sulfamethoxazole-trimethoprim 800-160 MG tablet Commonly known as:  BACTRIM DS,SEPTRA DS Take 1 tablet by mouth 2 (two) times daily.  Discharge Care Instructions        Start     Ordered   05/03/17 0000  sulfamethoxazole-trimethoprim (BACTRIM DS,SEPTRA DS) 800-160 MG tablet  2 times daily     05/03/17 1007      Diagnostic Studies: No results found.  Disposition: 01-Home or Self Care    Follow-up Information    Franchot Gallo, MD.   Specialty:  Urology Why:  If not scheduled, please call the office for a f/u appt in 2-3 weeks.  Contact information: [address] [place] [address] [phone]            Signed: Malka So 05/05/2017, 1:52 PM

## 2017-05-06 ENCOUNTER — Telehealth: Payer: Self-pay | Admitting: *Deleted

## 2017-05-06 NOTE — Telephone Encounter (Signed)
This RN returned patient's phone call regarding not being able to urinate. I instructed him to call [facility] since, he just had a TURP on Monday, 8/20. Patient verbalized understanding.

## 2017-05-24 ENCOUNTER — Telehealth: Payer: Self-pay | Admitting: *Deleted

## 2017-05-24 ENCOUNTER — Telehealth: Payer: Self-pay | Admitting: Medical Oncology

## 2017-05-24 NOTE — Telephone Encounter (Signed)
Mr. Caster called to informs [place] he will be receiving radiation in [place]. He has an appointment 05/27/17 to be seen by Ashlyn-PA/Dr. Tammi Klippel and would like to cancel. I notified her of the above.

## 2017-05-24 NOTE — Telephone Encounter (Signed)
Call came into TRIAGE from pt - Todd Holden states he will be obtaining his radiation therapy under Dr Donnal Debar at [place] and is inquiring " do I need to keep this appointment with Ashlyn Bruning ?"  Per discussion - pt states he

## 2017-05-25 ENCOUNTER — Telehealth: Payer: Self-pay | Admitting: Medical Oncology

## 2017-05-25 NOTE — Telephone Encounter (Signed)
Spoke with Todd Holden to confirm the cancellation of his appointment with Ashlyn 05/26/17. He has not started his radiation due to recent TURP but hopes to being in about 2 weeks. I wished him well and asked him to call me if he has any concerns. He voiced understanding and thanked me for the care he has received here at [facility].

## 2017-05-26 ENCOUNTER — Inpatient Hospital Stay
Admission: RE | Admit: 2017-05-26 | Discharge: 2017-05-26 | Disposition: A | Discharge status: Home / Self Care | Payer: Self-pay | Source: Ambulatory Visit | Attending: Urology | Admitting: Urology

## 2017-05-26 ENCOUNTER — Inpatient Hospital Stay: Admission: RE | Admit: 2017-05-26 | Payer: Self-pay | Source: Ambulatory Visit | Admitting: Urology

## 2017-06-15 DIAGNOSIS — C61 Malignant neoplasm of prostate: Secondary | ICD-10-CM | POA: Diagnosis not present

## 2017-06-15 DIAGNOSIS — C7951 Secondary malignant neoplasm of bone: Secondary | ICD-10-CM | POA: Diagnosis not present

## 2017-08-19 DIAGNOSIS — C7951 Secondary malignant neoplasm of bone: Secondary | ICD-10-CM | POA: Diagnosis not present

## 2017-08-19 DIAGNOSIS — K6289 Other specified diseases of anus and rectum: Secondary | ICD-10-CM | POA: Diagnosis not present

## 2017-08-19 DIAGNOSIS — C61 Malignant neoplasm of prostate: Secondary | ICD-10-CM | POA: Diagnosis not present

## 2017-09-30 ENCOUNTER — Encounter: Payer: Self-pay | Admitting: Internal Medicine

## 2017-10-20 DIAGNOSIS — Z923 Personal history of irradiation: Secondary | ICD-10-CM | POA: Diagnosis not present

## 2017-10-20 DIAGNOSIS — C7951 Secondary malignant neoplasm of bone: Secondary | ICD-10-CM | POA: Diagnosis not present

## 2017-10-20 DIAGNOSIS — C61 Malignant neoplasm of prostate: Secondary | ICD-10-CM | POA: Diagnosis not present

## 2017-12-01 ENCOUNTER — Ambulatory Visit: Payer: Medicaid Other | Admitting: Internal Medicine

## 2017-12-16 DIAGNOSIS — C61 Malignant neoplasm of prostate: Secondary | ICD-10-CM | POA: Diagnosis not present

## 2017-12-17 DIAGNOSIS — C7951 Secondary malignant neoplasm of bone: Secondary | ICD-10-CM | POA: Diagnosis not present

## 2017-12-17 DIAGNOSIS — C61 Malignant neoplasm of prostate: Secondary | ICD-10-CM | POA: Diagnosis not present

## 2017-12-17 DIAGNOSIS — Z79899 Other long term (current) drug therapy: Secondary | ICD-10-CM | POA: Diagnosis not present

## 2017-12-17 DIAGNOSIS — Z923 Personal history of irradiation: Secondary | ICD-10-CM | POA: Diagnosis not present

## 2017-12-21 DIAGNOSIS — Z5111 Encounter for antineoplastic chemotherapy: Secondary | ICD-10-CM | POA: Diagnosis not present

## 2017-12-21 DIAGNOSIS — C61 Malignant neoplasm of prostate: Secondary | ICD-10-CM | POA: Diagnosis not present

## 2018-02-16 DIAGNOSIS — C61 Malignant neoplasm of prostate: Secondary | ICD-10-CM | POA: Diagnosis not present

## 2018-02-17 DIAGNOSIS — C61 Malignant neoplasm of prostate: Secondary | ICD-10-CM | POA: Diagnosis not present

## 2018-02-17 DIAGNOSIS — Z79899 Other long term (current) drug therapy: Secondary | ICD-10-CM | POA: Diagnosis not present

## 2018-02-17 DIAGNOSIS — Z923 Personal history of irradiation: Secondary | ICD-10-CM | POA: Diagnosis not present

## 2018-02-17 DIAGNOSIS — C7951 Secondary malignant neoplasm of bone: Secondary | ICD-10-CM | POA: Diagnosis not present

## 2018-04-20 DIAGNOSIS — C61 Malignant neoplasm of prostate: Secondary | ICD-10-CM | POA: Diagnosis not present

## 2018-04-21 DIAGNOSIS — Z79899 Other long term (current) drug therapy: Secondary | ICD-10-CM | POA: Diagnosis not present

## 2018-04-21 DIAGNOSIS — C61 Malignant neoplasm of prostate: Secondary | ICD-10-CM | POA: Diagnosis not present

## 2018-04-21 DIAGNOSIS — C7951 Secondary malignant neoplasm of bone: Secondary | ICD-10-CM | POA: Diagnosis not present

## 2018-04-21 DIAGNOSIS — Z923 Personal history of irradiation: Secondary | ICD-10-CM | POA: Diagnosis not present

## 2018-06-02 DIAGNOSIS — C61 Malignant neoplasm of prostate: Secondary | ICD-10-CM | POA: Diagnosis not present

## 2018-07-07 IMAGING — CT CT ABD-PELV W/O CM
2 of 4 series · 14 of 46 positions shown, 16 images · non-contrast
Comparison: Renal ultrasound 01/08/2017.

CLINICAL DATA: Chronic dysuria and hydronephrosis for several years
with recent worsening. Acute renal failure. History of BPH.

EXAM:
CT ABDOMEN AND PELVIS WITHOUT CONTRAST
TECHNIQUE: Multidetector CT imaging of the abdomen and pelvis was performed
following the standard protocol without IV contrast.

[Series 3: abdroutine 5.0 i31s 1 · axial · 0.72mm/px · z∈[-502,-67]mm · 11 of 105 slices shown, 13 images]
[im 9/105  soft-tissue]
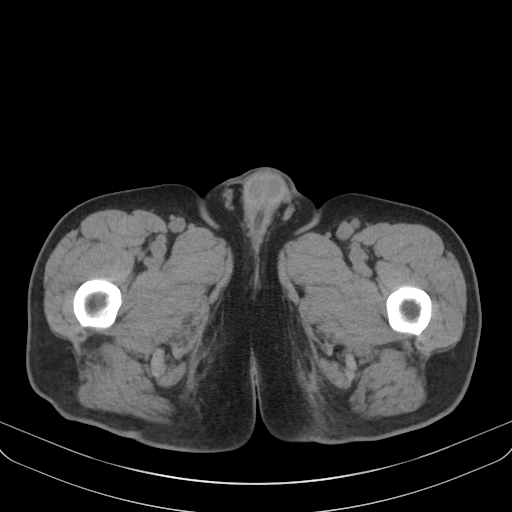
[im 9/105  bone]
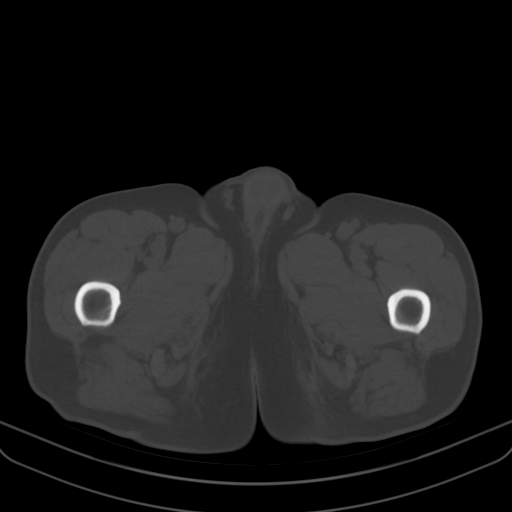
[im 17/105  soft-tissue]
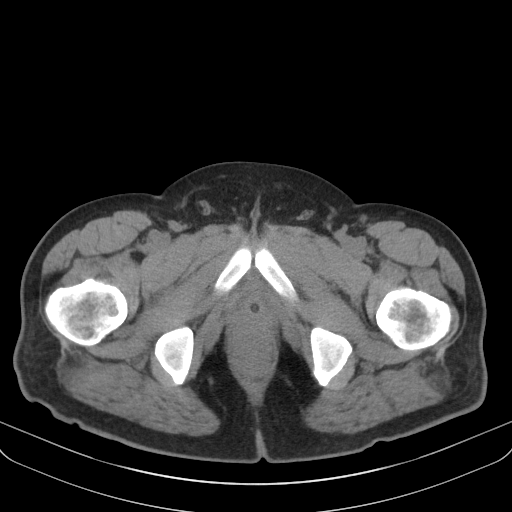
[im 25/105  soft-tissue]
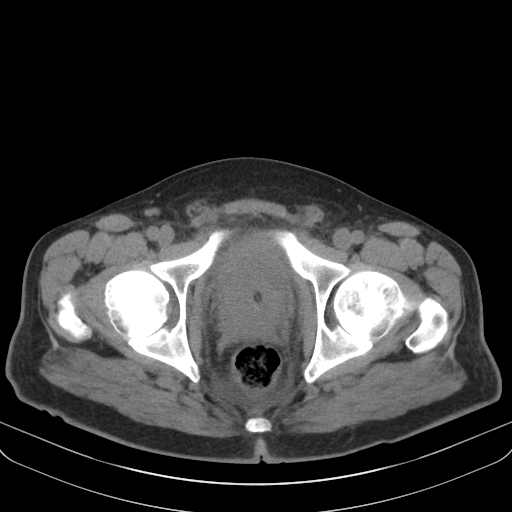
[im 34/105  soft-tissue]
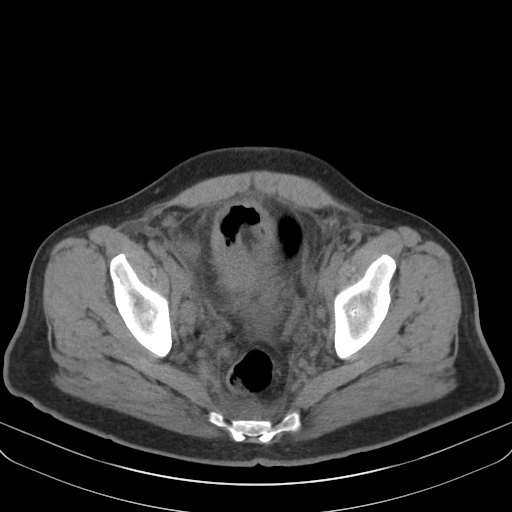
[im 42/105  soft-tissue]
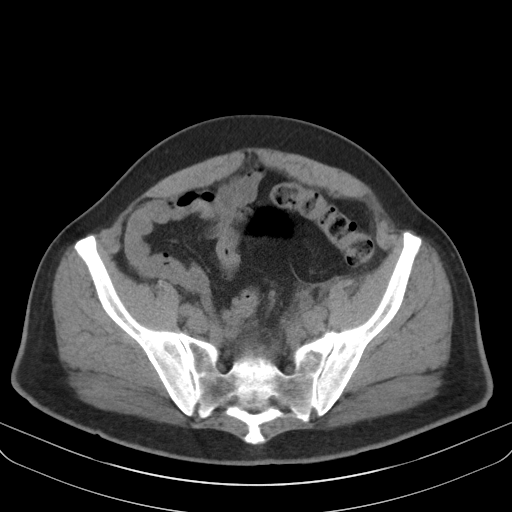
[im 55/105  soft-tissue]
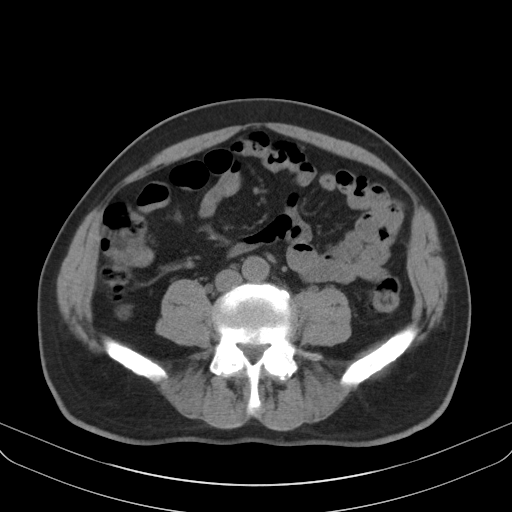
[im 63/105  soft-tissue]
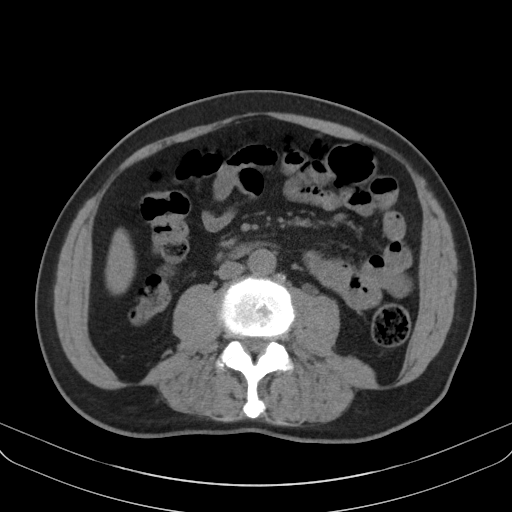
[im 71/105  soft-tissue]
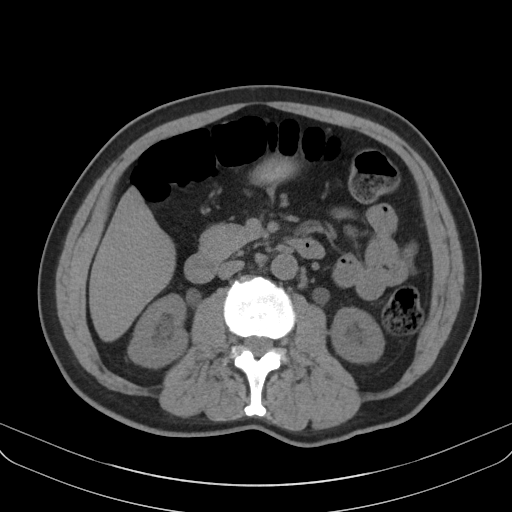
[im 80/105  soft-tissue]
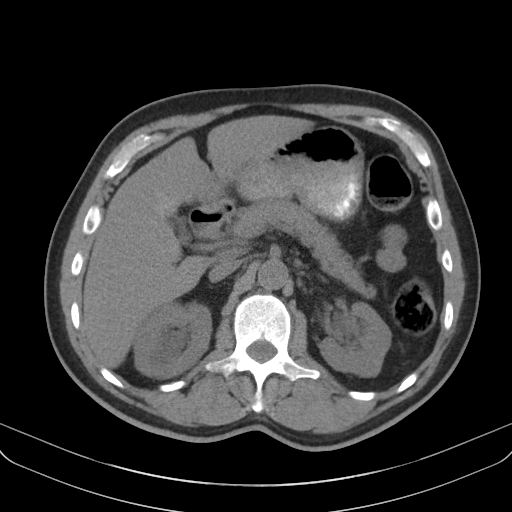
[im 80/105  bone]
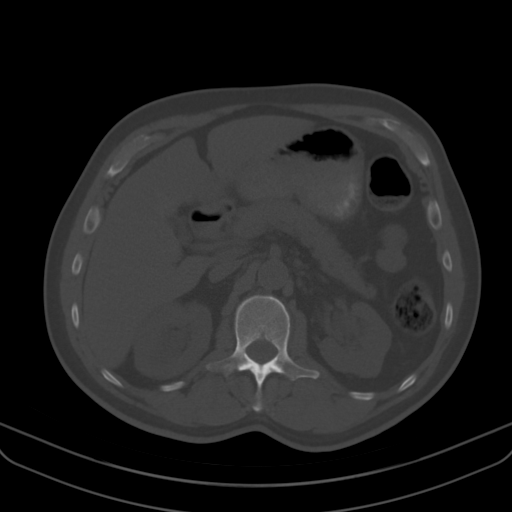
[im 88/105  soft-tissue]
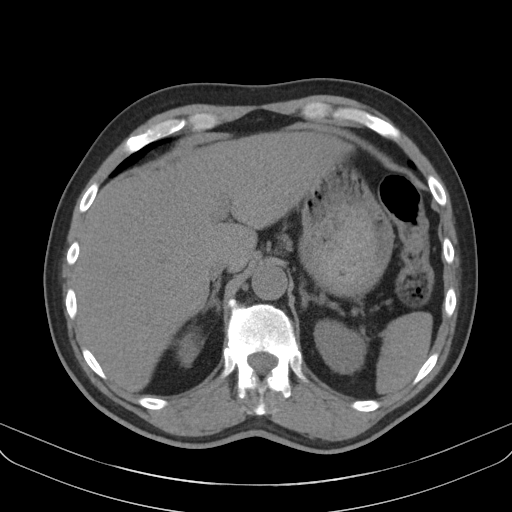
[im 96/105  soft-tissue]
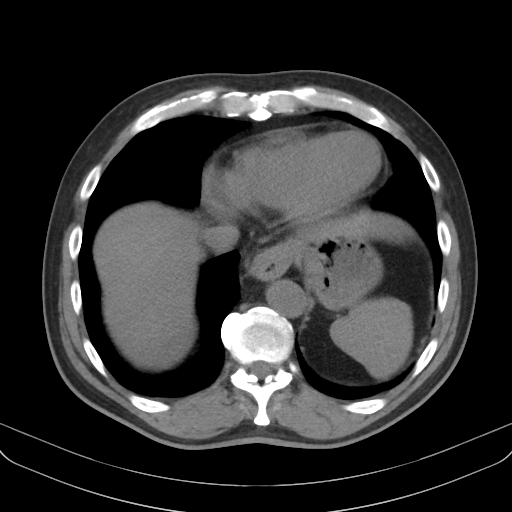

[Series 6: abdroutine 2.0 mpr cor · coronal · 0.76mm/px · 3 of 151 slices shown]
[im 51/151  soft-tissue]
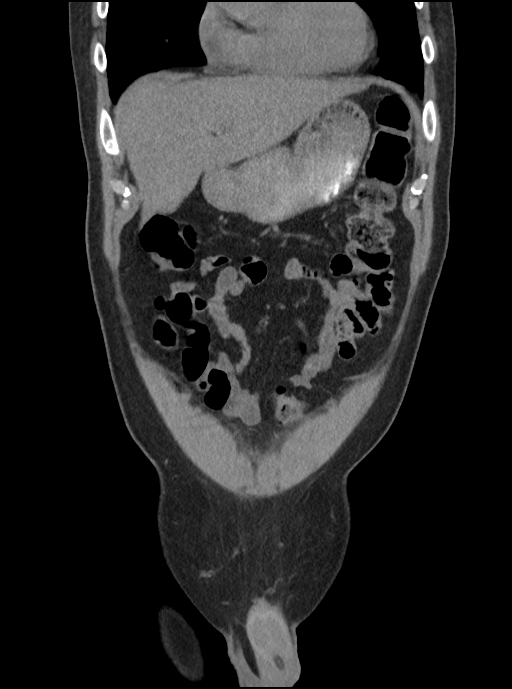
[im 67/151  soft-tissue]
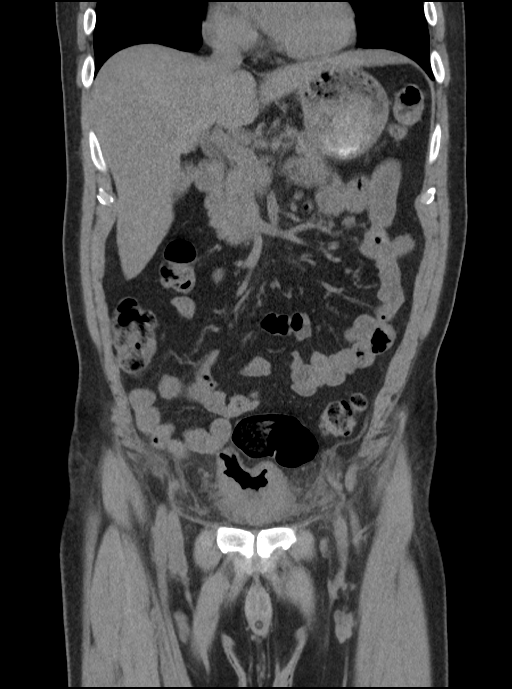
[im 84/151  soft-tissue]
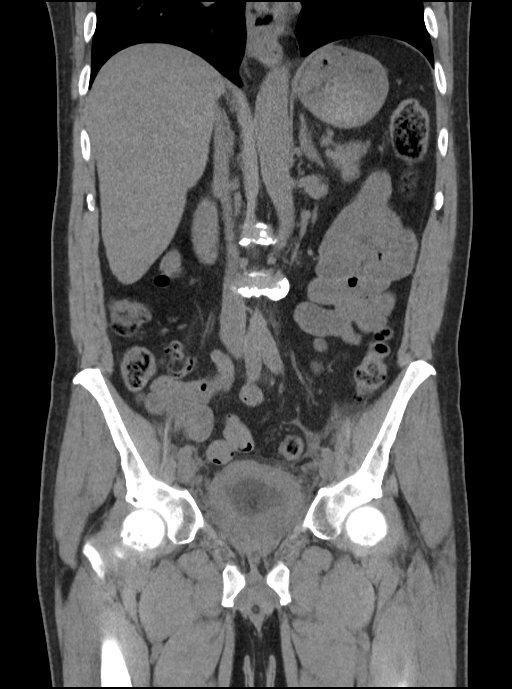

[14 of 46 positions shown; findings below may reference images not displayed]

FINDINGS: Lower chest: 5 mm right middle lobe nodule on image number 4 is
slightly ill-defined. The lung bases are otherwise clear. No
significant pleural or pericardial effusion. There is a small hiatal
hernia.

Hepatobiliary: The hepatic density is decreased consistent with
steatosis. There is mild sparing around the gallbladder. No focal
hepatic lesions are identified on noncontrast imaging. No evidence
of gallstones, gallbladder wall thickening or biliary dilatation.

Pancreas: Unremarkable. No pancreatic ductal dilatation or
surrounding inflammatory changes.

Spleen: Normal in size without focal abnormality.

Adrenals/Urinary Tract: Both adrenal glands appear normal. Both
kidneys demonstrate mild cortical thinning. As seen on recent
ultrasound, there is mild-to-moderate symmetric hydronephrosis and
hydroureter. Both ureters are dilated to the ureterovesical
junctions. No evidence of urinary tract calculus. 1.5 cm exophytic
lesion projecting from the upper pole of the left kidney is best
seen on the reformatted images, measuring higher than water density
(16-19 HU). No cyst was seen in this area on recent ultrasound.
There is marked bladder wall thickening. Foley catheter is in place
with air in the bladder lumen.

Stomach/Bowel: No evidence of bowel wall thickening, distention or
surrounding inflammatory change. The appendix appears normal.

Vascular/Lymphatic: Suspected right pelvic sidewall adenopathy, not
optimally evaluated on this noncontrast study, but measuring
approximately 14 mm on image 68 and 11 mm on image 72. Small
retroperitoneal lymph nodes are present bilaterally no acute
vascular findings are seen on noncontrast imaging. There is minimal
aortic and branch vessel atherosclerosis.

Reproductive: Mild enlargement of the prostate gland with asymmetric
central dystrophic calcifications on the right. The seminal vesicles
appear normal.

Other: There is soft tissue stranding throughout the perivesical and
perirectal fat. No focal fluid collections are seen.

Musculoskeletal: No acute or significant osseous findings. There are
mild endplate degenerative changes in the lumbar spine.
IMPRESSION: 1. Persistent bilateral hydronephrosis and hydroureter despite Foley
catheter placement and relative bladder decompression. There is
associated diffuse bladder wall thickening, perivesical and
perirectal soft tissue stranding. Findings could be secondary to
cystitis or chronic bladder outlet obstruction. However, bladder and
prostatic neoplasm cannot be excluded, and suspicion of neoplasm is
raised by the presence of right pelvic lymphadenopathy. Correlation
with serum PSA and urine cytology recommended.
2. Indeterminate small exophytic lesion projecting from the upper
pole the left kidney. This could reflect a small solid mass or
complex cyst, not seen on recent ultrasound.
3. Hepatic steatosis.
4. 5 mm right middle lobe pulmonary nodule. This appearance is
almost certainly benign, and no dedicated follow-up is required if
this patient is low risk for bronchogenic carcinoma (and has no
known or suspected primary neoplasm). Non-contrast chest CT can be
considered in 12 months if patient is high-risk. If the patient
proves to have pelvic neoplasm, full chest CT may be considered.
This recommendation follows the consensus statement: Guidelines for
Management of Incidental Pulmonary Nodules Detected on CT Images:

## 2018-07-12 DIAGNOSIS — J329 Chronic sinusitis, unspecified: Secondary | ICD-10-CM | POA: Diagnosis not present

## 2018-07-12 DIAGNOSIS — J40 Bronchitis, not specified as acute or chronic: Secondary | ICD-10-CM | POA: Diagnosis not present

## 2018-07-12 DIAGNOSIS — Z6825 Body mass index (BMI) 25.0-25.9, adult: Secondary | ICD-10-CM | POA: Diagnosis not present

## 2018-07-19 DIAGNOSIS — J329 Chronic sinusitis, unspecified: Secondary | ICD-10-CM | POA: Diagnosis not present

## 2018-07-19 DIAGNOSIS — Z6825 Body mass index (BMI) 25.0-25.9, adult: Secondary | ICD-10-CM | POA: Diagnosis not present

## 2018-07-19 DIAGNOSIS — J40 Bronchitis, not specified as acute or chronic: Secondary | ICD-10-CM | POA: Diagnosis not present

## 2018-07-27 DIAGNOSIS — J3801 Paralysis of vocal cords and larynx, unilateral: Secondary | ICD-10-CM | POA: Diagnosis not present

## 2018-07-27 DIAGNOSIS — R51 Headache: Secondary | ICD-10-CM | POA: Diagnosis not present

## 2018-07-27 DIAGNOSIS — J329 Chronic sinusitis, unspecified: Secondary | ICD-10-CM | POA: Diagnosis not present

## 2018-08-17 DIAGNOSIS — R490 Dysphonia: Secondary | ICD-10-CM | POA: Diagnosis not present

## 2018-08-18 IMAGING — NM NM BONE WHOLE BODY
2 series · 2 of 2 positions shown · non-contrast
Comparison: None in PACs

CLINICAL DATA: Recently diagnosed with prostate malignancy

EXAM:
NUCLEAR MEDICINE WHOLE BODY BONE SCAN
TECHNIQUE: Whole body anterior and posterior images were obtained approximately
3 hours after intravenous injection of radiopharmaceutical.
RADIOPHARMACEUTICALS:  Twenty-two mCi 6echnetium-QQm MDP IV

[Series 1: whole body · 2.66mm/px · 1 of 1 slices shown (1 of 2)]
[im 1/1]
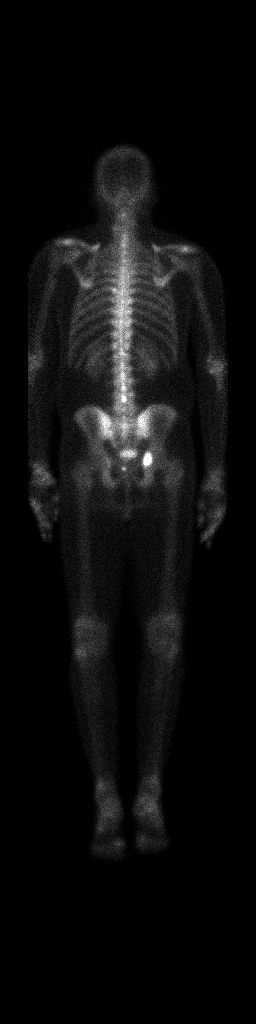

[Series 1: whole body · 2.66mm/px · 1 of 1 slices shown (2 of 2)]
[im 1/1]
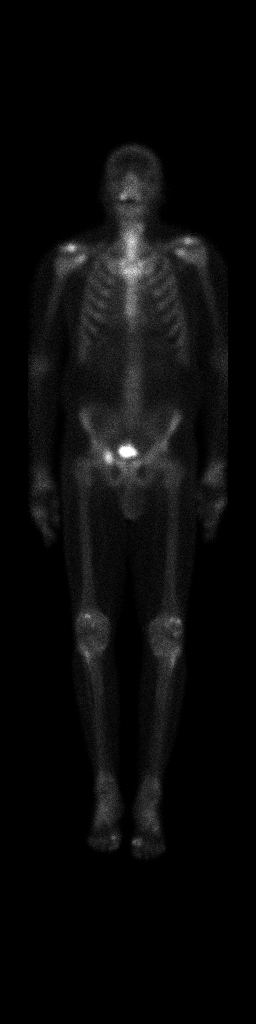

[2 of 2 positions shown; findings below may reference images not displayed]

FINDINGS: There is adequate uptake of the radiopharmaceutical by the skeleton.
Adequate soft tissue clearance and renal activity is observed. There
is no evidence of hydronephrosis.

There is intensely increased uptake in the medial aspect of the
right acetabulum. There is punctate increased uptake on the left in
the costovertebral junction. Uptake within the calvarium, spine,
upper extremities, and lower extremities exhibits no pattern to
suggest metastatic disease.
IMPRESSION: Abnormal increased uptake that projects over the medial aspect of
the right acetabulum worrisome for metastatic disease. No bony
abnormality was noted here on the CT scan of the abdomen and pelvis
January 13, 2017. A right hip series would be a useful next imaging
step.

The tiny focus of increased uptake of the left seventh
costovertebral junction would likely be inapparent on a rib series.

## 2018-08-23 DIAGNOSIS — R918 Other nonspecific abnormal finding of lung field: Secondary | ICD-10-CM | POA: Diagnosis not present

## 2018-08-26 DIAGNOSIS — R490 Dysphonia: Secondary | ICD-10-CM | POA: Diagnosis not present

## 2018-08-30 DIAGNOSIS — C61 Malignant neoplasm of prostate: Secondary | ICD-10-CM | POA: Diagnosis not present

## 2018-08-31 DIAGNOSIS — Z79899 Other long term (current) drug therapy: Secondary | ICD-10-CM

## 2018-08-31 DIAGNOSIS — R972 Elevated prostate specific antigen [PSA]: Secondary | ICD-10-CM | POA: Diagnosis not present

## 2018-08-31 DIAGNOSIS — M25551 Pain in right hip: Secondary | ICD-10-CM

## 2018-08-31 DIAGNOSIS — G893 Neoplasm related pain (acute) (chronic): Secondary | ICD-10-CM

## 2018-08-31 DIAGNOSIS — R634 Abnormal weight loss: Secondary | ICD-10-CM

## 2018-08-31 DIAGNOSIS — R918 Other nonspecific abnormal finding of lung field: Secondary | ICD-10-CM

## 2018-08-31 DIAGNOSIS — C7951 Secondary malignant neoplasm of bone: Secondary | ICD-10-CM | POA: Diagnosis not present

## 2018-08-31 DIAGNOSIS — R51 Headache: Secondary | ICD-10-CM | POA: Diagnosis not present

## 2018-08-31 DIAGNOSIS — C61 Malignant neoplasm of prostate: Secondary | ICD-10-CM | POA: Diagnosis not present

## 2018-08-31 DIAGNOSIS — Z923 Personal history of irradiation: Secondary | ICD-10-CM

## 2018-09-02 DIAGNOSIS — C772 Secondary and unspecified malignant neoplasm of intra-abdominal lymph nodes: Secondary | ICD-10-CM | POA: Diagnosis not present

## 2018-09-02 DIAGNOSIS — Z87891 Personal history of nicotine dependence: Secondary | ICD-10-CM

## 2018-09-02 DIAGNOSIS — Z8546 Personal history of malignant neoplasm of prostate: Secondary | ICD-10-CM | POA: Diagnosis not present

## 2018-09-02 DIAGNOSIS — C61 Malignant neoplasm of prostate: Secondary | ICD-10-CM | POA: Diagnosis not present

## 2018-09-02 DIAGNOSIS — C7989 Secondary malignant neoplasm of other specified sites: Secondary | ICD-10-CM | POA: Diagnosis not present

## 2018-09-02 DIAGNOSIS — C7951 Secondary malignant neoplasm of bone: Secondary | ICD-10-CM | POA: Diagnosis not present

## 2018-09-02 DIAGNOSIS — G939 Disorder of brain, unspecified: Secondary | ICD-10-CM | POA: Diagnosis not present

## 2018-09-05 DIAGNOSIS — R1310 Dysphagia, unspecified: Secondary | ICD-10-CM | POA: Diagnosis not present

## 2018-09-05 DIAGNOSIS — R490 Dysphonia: Secondary | ICD-10-CM | POA: Diagnosis not present

## 2018-09-05 DIAGNOSIS — R0602 Shortness of breath: Secondary | ICD-10-CM | POA: Diagnosis not present

## 2018-09-05 DIAGNOSIS — N400 Enlarged prostate without lower urinary tract symptoms: Secondary | ICD-10-CM | POA: Diagnosis not present

## 2018-09-05 DIAGNOSIS — Z923 Personal history of irradiation: Secondary | ICD-10-CM | POA: Diagnosis not present

## 2018-09-05 DIAGNOSIS — K210 Gastro-esophageal reflux disease with esophagitis: Secondary | ICD-10-CM | POA: Diagnosis not present

## 2018-09-05 DIAGNOSIS — R590 Localized enlarged lymph nodes: Secondary | ICD-10-CM | POA: Diagnosis not present

## 2018-09-05 DIAGNOSIS — C61 Malignant neoplasm of prostate: Secondary | ICD-10-CM | POA: Diagnosis not present

## 2018-09-05 DIAGNOSIS — Z6821 Body mass index (BMI) 21.0-21.9, adult: Secondary | ICD-10-CM | POA: Diagnosis not present

## 2018-09-05 DIAGNOSIS — R634 Abnormal weight loss: Secondary | ICD-10-CM | POA: Diagnosis not present

## 2018-09-05 DIAGNOSIS — Z87891 Personal history of nicotine dependence: Secondary | ICD-10-CM | POA: Diagnosis not present

## 2018-09-05 DIAGNOSIS — R627 Adult failure to thrive: Secondary | ICD-10-CM | POA: Diagnosis not present

## 2018-09-05 DIAGNOSIS — R479 Unspecified speech disturbances: Secondary | ICD-10-CM | POA: Diagnosis not present

## 2018-09-05 DIAGNOSIS — I10 Essential (primary) hypertension: Secondary | ICD-10-CM | POA: Diagnosis not present

## 2018-09-06 DIAGNOSIS — Z8546 Personal history of malignant neoplasm of prostate: Secondary | ICD-10-CM | POA: Diagnosis not present

## 2018-09-06 DIAGNOSIS — R490 Dysphonia: Secondary | ICD-10-CM | POA: Diagnosis not present

## 2018-09-06 DIAGNOSIS — R634 Abnormal weight loss: Secondary | ICD-10-CM | POA: Diagnosis not present

## 2018-09-06 DIAGNOSIS — R918 Other nonspecific abnormal finding of lung field: Secondary | ICD-10-CM | POA: Diagnosis not present

## 2018-09-06 DIAGNOSIS — R633 Feeding difficulties: Secondary | ICD-10-CM | POA: Diagnosis not present

## 2018-09-13 DIAGNOSIS — C61 Malignant neoplasm of prostate: Secondary | ICD-10-CM | POA: Diagnosis not present

## 2018-09-13 DIAGNOSIS — C799 Secondary malignant neoplasm of unspecified site: Secondary | ICD-10-CM | POA: Diagnosis not present

## 2018-09-21 DIAGNOSIS — C61 Malignant neoplasm of prostate: Secondary | ICD-10-CM | POA: Diagnosis not present

## 2018-09-21 DIAGNOSIS — R937 Abnormal findings on diagnostic imaging of other parts of musculoskeletal system: Secondary | ICD-10-CM | POA: Diagnosis not present

## 2018-09-21 DIAGNOSIS — C799 Secondary malignant neoplasm of unspecified site: Secondary | ICD-10-CM | POA: Diagnosis not present

## 2018-10-07 DIAGNOSIS — G959 Disease of spinal cord, unspecified: Secondary | ICD-10-CM | POA: Diagnosis not present

## 2018-10-07 DIAGNOSIS — F05 Delirium due to known physiological condition: Secondary | ICD-10-CM | POA: Diagnosis not present

## 2018-10-07 DIAGNOSIS — R627 Adult failure to thrive: Secondary | ICD-10-CM | POA: Diagnosis not present

## 2018-10-07 DIAGNOSIS — N1339 Other hydronephrosis: Secondary | ICD-10-CM | POA: Diagnosis not present

## 2018-10-07 DIAGNOSIS — D72829 Elevated white blood cell count, unspecified: Secondary | ICD-10-CM | POA: Diagnosis not present

## 2018-10-07 DIAGNOSIS — C3490 Malignant neoplasm of unspecified part of unspecified bronchus or lung: Secondary | ICD-10-CM | POA: Diagnosis not present

## 2018-10-07 DIAGNOSIS — R5383 Other fatigue: Secondary | ICD-10-CM | POA: Diagnosis not present

## 2018-10-07 DIAGNOSIS — R338 Other retention of urine: Secondary | ICD-10-CM | POA: Diagnosis not present

## 2018-10-07 DIAGNOSIS — E46 Unspecified protein-calorie malnutrition: Secondary | ICD-10-CM | POA: Diagnosis not present

## 2018-10-07 DIAGNOSIS — E232 Diabetes insipidus: Secondary | ICD-10-CM | POA: Diagnosis not present

## 2018-10-07 DIAGNOSIS — E43 Unspecified severe protein-calorie malnutrition: Secondary | ICD-10-CM | POA: Diagnosis not present

## 2018-10-07 DIAGNOSIS — Z515 Encounter for palliative care: Secondary | ICD-10-CM | POA: Diagnosis not present

## 2018-10-07 DIAGNOSIS — R1310 Dysphagia, unspecified: Secondary | ICD-10-CM | POA: Diagnosis not present

## 2018-10-07 DIAGNOSIS — R5381 Other malaise: Secondary | ICD-10-CM | POA: Diagnosis not present

## 2018-10-07 DIAGNOSIS — N401 Enlarged prostate with lower urinary tract symptoms: Secondary | ICD-10-CM | POA: Diagnosis not present

## 2018-10-07 DIAGNOSIS — R451 Restlessness and agitation: Secondary | ICD-10-CM | POA: Diagnosis not present

## 2018-10-07 DIAGNOSIS — F419 Anxiety disorder, unspecified: Secondary | ICD-10-CM | POA: Diagnosis not present

## 2018-10-07 DIAGNOSIS — G893 Neoplasm related pain (acute) (chronic): Secondary | ICD-10-CM | POA: Diagnosis not present

## 2018-10-07 DIAGNOSIS — C779 Secondary and unspecified malignant neoplasm of lymph node, unspecified: Secondary | ICD-10-CM | POA: Diagnosis not present

## 2018-10-07 DIAGNOSIS — R531 Weakness: Secondary | ICD-10-CM | POA: Diagnosis not present

## 2018-10-07 DIAGNOSIS — R638 Other symptoms and signs concerning food and fluid intake: Secondary | ICD-10-CM | POA: Diagnosis not present

## 2018-10-07 DIAGNOSIS — C772 Secondary and unspecified malignant neoplasm of intra-abdominal lymph nodes: Secondary | ICD-10-CM | POA: Diagnosis not present

## 2018-10-07 DIAGNOSIS — K210 Gastro-esophageal reflux disease with esophagitis: Secondary | ICD-10-CM | POA: Diagnosis not present

## 2018-10-07 DIAGNOSIS — E871 Hypo-osmolality and hyponatremia: Secondary | ICD-10-CM | POA: Diagnosis not present

## 2018-10-07 DIAGNOSIS — R000 Tachycardia, unspecified: Secondary | ICD-10-CM | POA: Diagnosis not present

## 2018-10-07 DIAGNOSIS — C44329 Squamous cell carcinoma of skin of other parts of face: Secondary | ICD-10-CM | POA: Diagnosis not present

## 2018-10-07 DIAGNOSIS — R339 Retention of urine, unspecified: Secondary | ICD-10-CM | POA: Diagnosis not present

## 2018-10-07 DIAGNOSIS — M8448XA Pathological fracture, other site, initial encounter for fracture: Secondary | ICD-10-CM | POA: Diagnosis not present

## 2018-10-07 DIAGNOSIS — N138 Other obstructive and reflux uropathy: Secondary | ICD-10-CM | POA: Diagnosis not present

## 2018-10-07 DIAGNOSIS — R4789 Other speech disturbances: Secondary | ICD-10-CM | POA: Diagnosis not present

## 2018-10-07 DIAGNOSIS — C61 Malignant neoplasm of prostate: Secondary | ICD-10-CM | POA: Diagnosis not present

## 2018-10-07 DIAGNOSIS — C781 Secondary malignant neoplasm of mediastinum: Secondary | ICD-10-CM | POA: Diagnosis not present

## 2018-10-07 DIAGNOSIS — R1013 Epigastric pain: Secondary | ICD-10-CM | POA: Diagnosis not present

## 2018-10-07 DIAGNOSIS — C7989 Secondary malignant neoplasm of other specified sites: Secondary | ICD-10-CM | POA: Diagnosis not present

## 2018-10-07 DIAGNOSIS — Z681 Body mass index (BMI) 19 or less, adult: Secondary | ICD-10-CM | POA: Diagnosis not present

## 2018-10-07 DIAGNOSIS — R682 Dry mouth, unspecified: Secondary | ICD-10-CM | POA: Diagnosis not present

## 2018-10-07 DIAGNOSIS — C7951 Secondary malignant neoplasm of bone: Secondary | ICD-10-CM | POA: Diagnosis not present

## 2018-10-07 DIAGNOSIS — N1330 Unspecified hydronephrosis: Secondary | ICD-10-CM | POA: Diagnosis not present

## 2018-10-07 DIAGNOSIS — E870 Hyperosmolality and hypernatremia: Secondary | ICD-10-CM | POA: Diagnosis not present

## 2018-10-07 DIAGNOSIS — R9431 Abnormal electrocardiogram [ECG] [EKG]: Secondary | ICD-10-CM | POA: Diagnosis not present

## 2018-10-07 DIAGNOSIS — K117 Disturbances of salivary secretion: Secondary | ICD-10-CM | POA: Diagnosis not present

## 2018-10-08 DIAGNOSIS — C7989 Secondary malignant neoplasm of other specified sites: Secondary | ICD-10-CM | POA: Diagnosis not present

## 2018-10-08 DIAGNOSIS — N1330 Unspecified hydronephrosis: Secondary | ICD-10-CM | POA: Diagnosis not present

## 2018-10-08 DIAGNOSIS — C7951 Secondary malignant neoplasm of bone: Secondary | ICD-10-CM | POA: Diagnosis not present

## 2018-10-08 DIAGNOSIS — C779 Secondary and unspecified malignant neoplasm of lymph node, unspecified: Secondary | ICD-10-CM | POA: Diagnosis not present

## 2018-10-08 DIAGNOSIS — C61 Malignant neoplasm of prostate: Secondary | ICD-10-CM | POA: Diagnosis not present

## 2018-10-08 DIAGNOSIS — R627 Adult failure to thrive: Secondary | ICD-10-CM | POA: Diagnosis not present

## 2018-10-08 DIAGNOSIS — E871 Hypo-osmolality and hyponatremia: Secondary | ICD-10-CM | POA: Diagnosis not present

## 2018-10-09 DIAGNOSIS — R338 Other retention of urine: Secondary | ICD-10-CM | POA: Diagnosis not present

## 2018-10-09 DIAGNOSIS — N1339 Other hydronephrosis: Secondary | ICD-10-CM | POA: Diagnosis not present

## 2018-10-09 DIAGNOSIS — R627 Adult failure to thrive: Secondary | ICD-10-CM | POA: Diagnosis not present

## 2018-10-09 DIAGNOSIS — E871 Hypo-osmolality and hyponatremia: Secondary | ICD-10-CM | POA: Diagnosis not present

## 2018-10-09 DIAGNOSIS — C7989 Secondary malignant neoplasm of other specified sites: Secondary | ICD-10-CM | POA: Diagnosis not present

## 2018-10-09 DIAGNOSIS — C61 Malignant neoplasm of prostate: Secondary | ICD-10-CM | POA: Diagnosis not present

## 2018-10-09 DIAGNOSIS — E43 Unspecified severe protein-calorie malnutrition: Secondary | ICD-10-CM | POA: Diagnosis not present

## 2018-10-10 DIAGNOSIS — E43 Unspecified severe protein-calorie malnutrition: Secondary | ICD-10-CM | POA: Diagnosis not present

## 2018-10-10 DIAGNOSIS — R627 Adult failure to thrive: Secondary | ICD-10-CM | POA: Diagnosis not present

## 2018-10-10 DIAGNOSIS — R1310 Dysphagia, unspecified: Secondary | ICD-10-CM | POA: Diagnosis not present

## 2018-10-10 DIAGNOSIS — N1330 Unspecified hydronephrosis: Secondary | ICD-10-CM | POA: Diagnosis not present

## 2018-10-10 DIAGNOSIS — C61 Malignant neoplasm of prostate: Secondary | ICD-10-CM | POA: Diagnosis not present

## 2018-10-11 DIAGNOSIS — R5383 Other fatigue: Secondary | ICD-10-CM | POA: Diagnosis not present

## 2018-10-11 DIAGNOSIS — C779 Secondary and unspecified malignant neoplasm of lymph node, unspecified: Secondary | ICD-10-CM | POA: Diagnosis not present

## 2018-10-11 DIAGNOSIS — E43 Unspecified severe protein-calorie malnutrition: Secondary | ICD-10-CM | POA: Diagnosis not present

## 2018-10-11 DIAGNOSIS — Z515 Encounter for palliative care: Secondary | ICD-10-CM | POA: Diagnosis not present

## 2018-10-11 DIAGNOSIS — C61 Malignant neoplasm of prostate: Secondary | ICD-10-CM | POA: Diagnosis not present

## 2018-10-11 DIAGNOSIS — E232 Diabetes insipidus: Secondary | ICD-10-CM | POA: Diagnosis not present

## 2018-10-11 DIAGNOSIS — C7951 Secondary malignant neoplasm of bone: Secondary | ICD-10-CM | POA: Diagnosis not present

## 2018-10-11 DIAGNOSIS — R627 Adult failure to thrive: Secondary | ICD-10-CM | POA: Diagnosis not present

## 2018-10-12 DIAGNOSIS — Z515 Encounter for palliative care: Secondary | ICD-10-CM | POA: Diagnosis not present

## 2018-10-12 DIAGNOSIS — C61 Malignant neoplasm of prostate: Secondary | ICD-10-CM | POA: Diagnosis not present

## 2018-10-12 DIAGNOSIS — E43 Unspecified severe protein-calorie malnutrition: Secondary | ICD-10-CM | POA: Diagnosis not present

## 2018-10-12 DIAGNOSIS — C7989 Secondary malignant neoplasm of other specified sites: Secondary | ICD-10-CM | POA: Diagnosis not present

## 2018-10-12 DIAGNOSIS — R531 Weakness: Secondary | ICD-10-CM | POA: Diagnosis not present

## 2018-10-12 DIAGNOSIS — R627 Adult failure to thrive: Secondary | ICD-10-CM | POA: Diagnosis not present

## 2018-10-12 DIAGNOSIS — R5383 Other fatigue: Secondary | ICD-10-CM | POA: Diagnosis not present

## 2018-10-12 DIAGNOSIS — R682 Dry mouth, unspecified: Secondary | ICD-10-CM | POA: Diagnosis not present

## 2018-10-12 DIAGNOSIS — E232 Diabetes insipidus: Secondary | ICD-10-CM | POA: Diagnosis not present

## 2018-10-12 DIAGNOSIS — R638 Other symptoms and signs concerning food and fluid intake: Secondary | ICD-10-CM | POA: Diagnosis not present

## 2018-10-13 DIAGNOSIS — Z515 Encounter for palliative care: Secondary | ICD-10-CM | POA: Diagnosis not present

## 2018-10-13 DIAGNOSIS — R638 Other symptoms and signs concerning food and fluid intake: Secondary | ICD-10-CM | POA: Diagnosis not present

## 2018-10-13 DIAGNOSIS — C61 Malignant neoplasm of prostate: Secondary | ICD-10-CM | POA: Diagnosis not present

## 2018-10-13 DIAGNOSIS — R627 Adult failure to thrive: Secondary | ICD-10-CM | POA: Diagnosis not present

## 2018-10-13 DIAGNOSIS — R5383 Other fatigue: Secondary | ICD-10-CM | POA: Diagnosis not present

## 2018-10-13 DIAGNOSIS — E870 Hyperosmolality and hypernatremia: Secondary | ICD-10-CM | POA: Diagnosis not present

## 2018-10-13 DIAGNOSIS — E43 Unspecified severe protein-calorie malnutrition: Secondary | ICD-10-CM | POA: Diagnosis not present

## 2018-10-13 DIAGNOSIS — R682 Dry mouth, unspecified: Secondary | ICD-10-CM | POA: Diagnosis not present

## 2018-10-14 DIAGNOSIS — Z515 Encounter for palliative care: Secondary | ICD-10-CM | POA: Diagnosis not present

## 2018-10-14 DIAGNOSIS — C779 Secondary and unspecified malignant neoplasm of lymph node, unspecified: Secondary | ICD-10-CM | POA: Diagnosis not present

## 2018-10-14 DIAGNOSIS — C7989 Secondary malignant neoplasm of other specified sites: Secondary | ICD-10-CM | POA: Diagnosis not present

## 2018-10-14 DIAGNOSIS — C7951 Secondary malignant neoplasm of bone: Secondary | ICD-10-CM | POA: Diagnosis not present

## 2018-10-14 DIAGNOSIS — E232 Diabetes insipidus: Secondary | ICD-10-CM | POA: Diagnosis not present

## 2018-10-14 DIAGNOSIS — E870 Hyperosmolality and hypernatremia: Secondary | ICD-10-CM | POA: Diagnosis not present

## 2018-10-14 DIAGNOSIS — C61 Malignant neoplasm of prostate: Secondary | ICD-10-CM | POA: Diagnosis not present

## 2018-10-14 DIAGNOSIS — R627 Adult failure to thrive: Secondary | ICD-10-CM | POA: Diagnosis not present

## 2018-10-14 DIAGNOSIS — E43 Unspecified severe protein-calorie malnutrition: Secondary | ICD-10-CM | POA: Diagnosis not present

## 2018-10-14 DIAGNOSIS — F419 Anxiety disorder, unspecified: Secondary | ICD-10-CM | POA: Diagnosis not present

## 2018-10-14 DIAGNOSIS — R682 Dry mouth, unspecified: Secondary | ICD-10-CM | POA: Diagnosis not present

## 2018-10-15 DIAGNOSIS — E870 Hyperosmolality and hypernatremia: Secondary | ICD-10-CM | POA: Diagnosis not present

## 2018-10-15 DIAGNOSIS — E43 Unspecified severe protein-calorie malnutrition: Secondary | ICD-10-CM | POA: Diagnosis not present

## 2018-10-15 DIAGNOSIS — C61 Malignant neoplasm of prostate: Secondary | ICD-10-CM | POA: Diagnosis not present

## 2018-10-15 DIAGNOSIS — R627 Adult failure to thrive: Secondary | ICD-10-CM | POA: Diagnosis not present

## 2018-10-16 DIAGNOSIS — R627 Adult failure to thrive: Secondary | ICD-10-CM | POA: Diagnosis not present

## 2018-10-16 DIAGNOSIS — C7989 Secondary malignant neoplasm of other specified sites: Secondary | ICD-10-CM | POA: Diagnosis not present

## 2018-10-16 DIAGNOSIS — E870 Hyperosmolality and hypernatremia: Secondary | ICD-10-CM | POA: Diagnosis not present

## 2018-10-16 DIAGNOSIS — E43 Unspecified severe protein-calorie malnutrition: Secondary | ICD-10-CM | POA: Diagnosis not present

## 2018-10-16 DIAGNOSIS — D72829 Elevated white blood cell count, unspecified: Secondary | ICD-10-CM | POA: Diagnosis not present

## 2018-10-17 DIAGNOSIS — C7989 Secondary malignant neoplasm of other specified sites: Secondary | ICD-10-CM | POA: Diagnosis not present

## 2018-10-17 DIAGNOSIS — Z515 Encounter for palliative care: Secondary | ICD-10-CM | POA: Diagnosis not present

## 2018-10-17 DIAGNOSIS — R5383 Other fatigue: Secondary | ICD-10-CM | POA: Diagnosis not present

## 2018-10-17 DIAGNOSIS — E232 Diabetes insipidus: Secondary | ICD-10-CM | POA: Diagnosis not present

## 2018-10-17 DIAGNOSIS — R627 Adult failure to thrive: Secondary | ICD-10-CM | POA: Diagnosis not present

## 2018-10-17 DIAGNOSIS — G893 Neoplasm related pain (acute) (chronic): Secondary | ICD-10-CM | POA: Diagnosis not present

## 2018-10-17 DIAGNOSIS — R638 Other symptoms and signs concerning food and fluid intake: Secondary | ICD-10-CM | POA: Diagnosis not present

## 2018-10-17 DIAGNOSIS — E870 Hyperosmolality and hypernatremia: Secondary | ICD-10-CM | POA: Diagnosis not present

## 2018-10-18 DIAGNOSIS — R627 Adult failure to thrive: Secondary | ICD-10-CM | POA: Diagnosis not present

## 2018-10-18 DIAGNOSIS — R638 Other symptoms and signs concerning food and fluid intake: Secondary | ICD-10-CM | POA: Diagnosis not present

## 2018-10-18 DIAGNOSIS — C7989 Secondary malignant neoplasm of other specified sites: Secondary | ICD-10-CM | POA: Diagnosis not present

## 2018-10-18 DIAGNOSIS — R5383 Other fatigue: Secondary | ICD-10-CM | POA: Diagnosis not present

## 2018-10-18 DIAGNOSIS — G893 Neoplasm related pain (acute) (chronic): Secondary | ICD-10-CM | POA: Diagnosis not present

## 2018-10-18 DIAGNOSIS — Z515 Encounter for palliative care: Secondary | ICD-10-CM | POA: Diagnosis not present

## 2018-10-19 DIAGNOSIS — R627 Adult failure to thrive: Secondary | ICD-10-CM | POA: Diagnosis not present

## 2018-10-20 DIAGNOSIS — R627 Adult failure to thrive: Secondary | ICD-10-CM | POA: Diagnosis not present

## 2018-10-21 DIAGNOSIS — F419 Anxiety disorder, unspecified: Secondary | ICD-10-CM | POA: Diagnosis not present

## 2018-10-21 DIAGNOSIS — K117 Disturbances of salivary secretion: Secondary | ICD-10-CM | POA: Diagnosis not present

## 2018-10-21 DIAGNOSIS — R451 Restlessness and agitation: Secondary | ICD-10-CM | POA: Diagnosis not present

## 2018-10-21 DIAGNOSIS — Z515 Encounter for palliative care: Secondary | ICD-10-CM | POA: Diagnosis not present

## 2018-10-21 DIAGNOSIS — R627 Adult failure to thrive: Secondary | ICD-10-CM | POA: Diagnosis not present

## 2018-10-22 DIAGNOSIS — E43 Unspecified severe protein-calorie malnutrition: Secondary | ICD-10-CM | POA: Diagnosis not present

## 2018-10-22 DIAGNOSIS — R5381 Other malaise: Secondary | ICD-10-CM | POA: Diagnosis not present

## 2018-10-22 DIAGNOSIS — R627 Adult failure to thrive: Secondary | ICD-10-CM | POA: Diagnosis not present

## 2018-10-22 DIAGNOSIS — C61 Malignant neoplasm of prostate: Secondary | ICD-10-CM | POA: Diagnosis not present

## 2018-10-23 DIAGNOSIS — C61 Malignant neoplasm of prostate: Secondary | ICD-10-CM | POA: Diagnosis not present

## 2018-10-23 DIAGNOSIS — R627 Adult failure to thrive: Secondary | ICD-10-CM | POA: Diagnosis not present

## 2018-10-23 DIAGNOSIS — E46 Unspecified protein-calorie malnutrition: Secondary | ICD-10-CM | POA: Diagnosis not present

## 2018-10-23 DIAGNOSIS — R5381 Other malaise: Secondary | ICD-10-CM | POA: Diagnosis not present

## 2018-10-24 DIAGNOSIS — C61 Malignant neoplasm of prostate: Secondary | ICD-10-CM | POA: Diagnosis not present

## 2018-10-24 DIAGNOSIS — K210 Gastro-esophageal reflux disease with esophagitis: Secondary | ICD-10-CM | POA: Diagnosis not present

## 2018-10-24 DIAGNOSIS — C44329 Squamous cell carcinoma of skin of other parts of face: Secondary | ICD-10-CM | POA: Diagnosis not present

## 2018-10-24 DIAGNOSIS — R627 Adult failure to thrive: Secondary | ICD-10-CM | POA: Diagnosis not present

## 2018-11-13 DEATH — deceased

## 2019-01-14 IMAGING — US US RENAL
1 series · 14 of 25 positions shown · non-contrast
Comparison: None.

CLINICAL DATA: Acute renal failure.

EXAM:
RENAL / URINARY TRACT ULTRASOUND COMPLETE

[Series 1: us renal · 0.25mm/px · 14 of 39 slices shown]
[im 1/39]
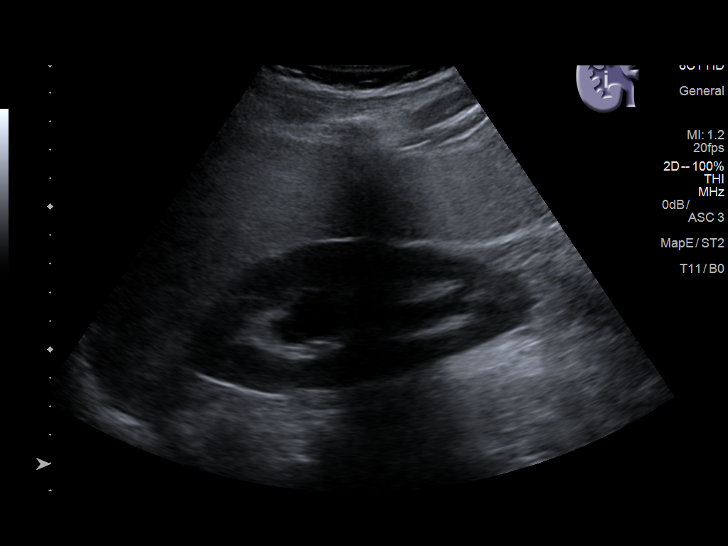
[im 4/39]
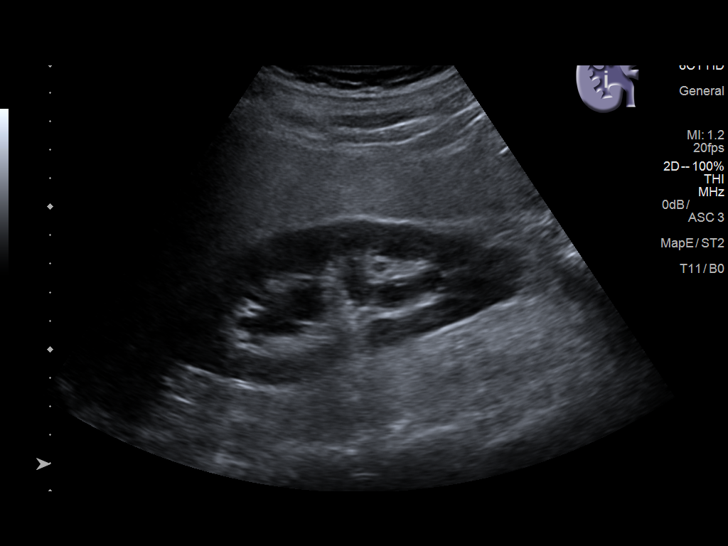
[im 7/39]
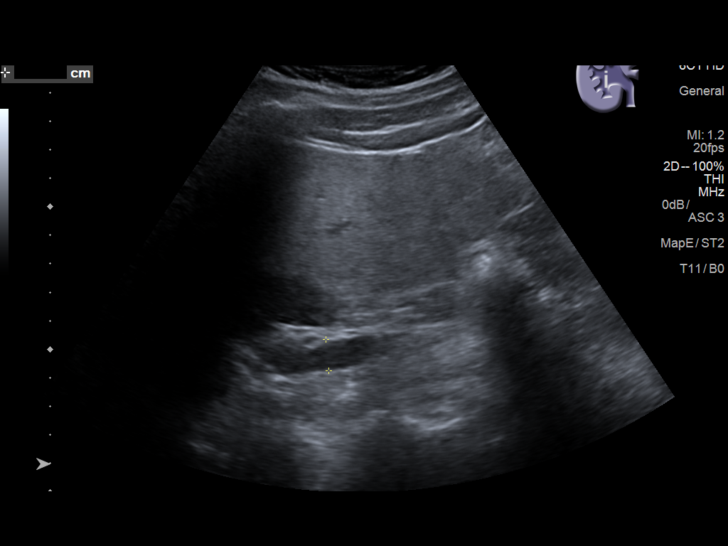
[im 10/39]
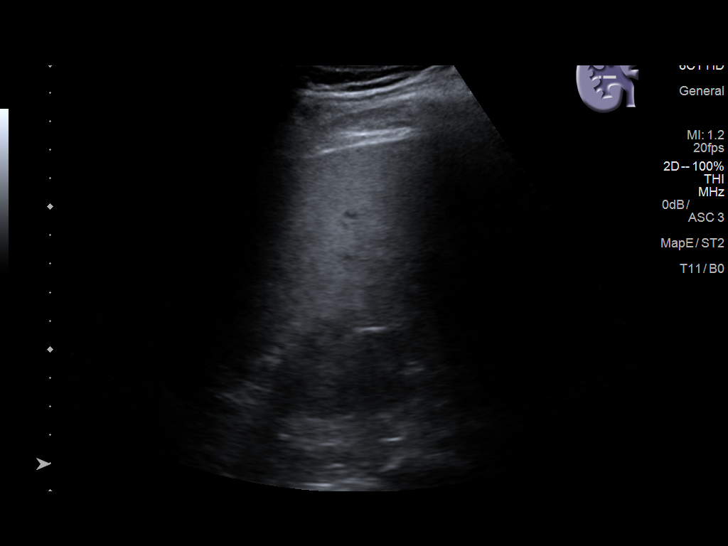
[im 13/39]
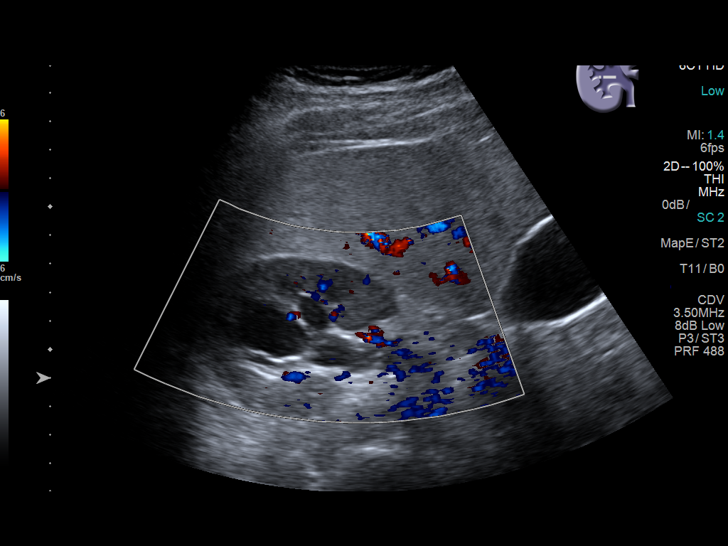
[im 15/39]
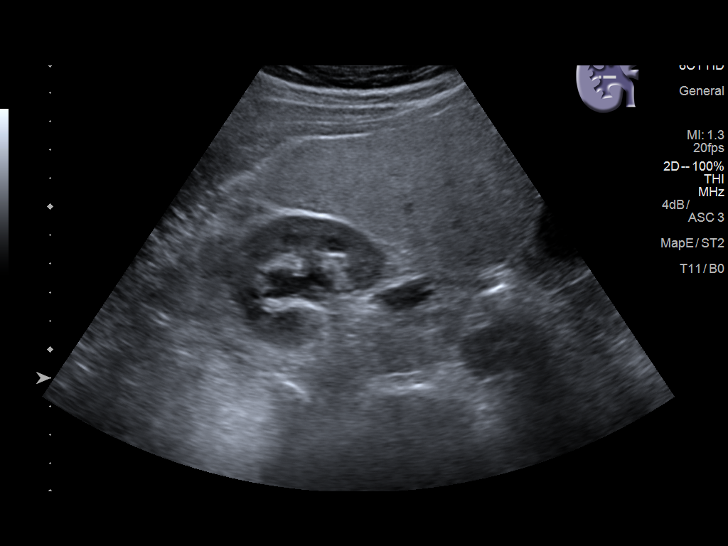
[im 18/39]
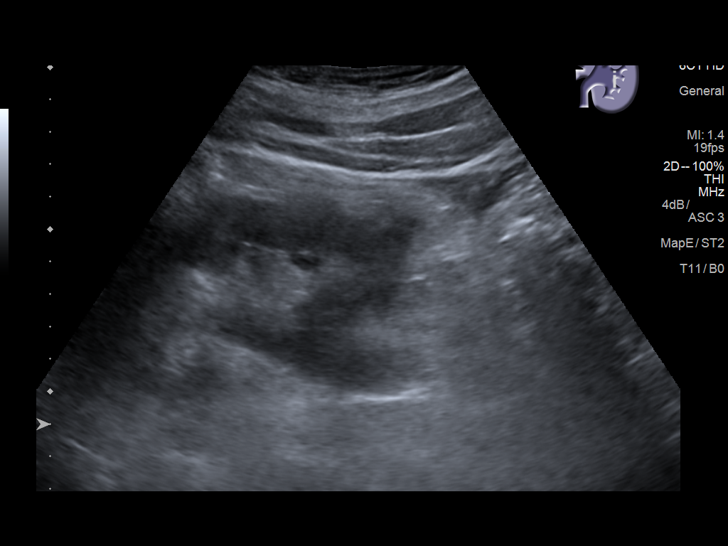
[im 21/39]
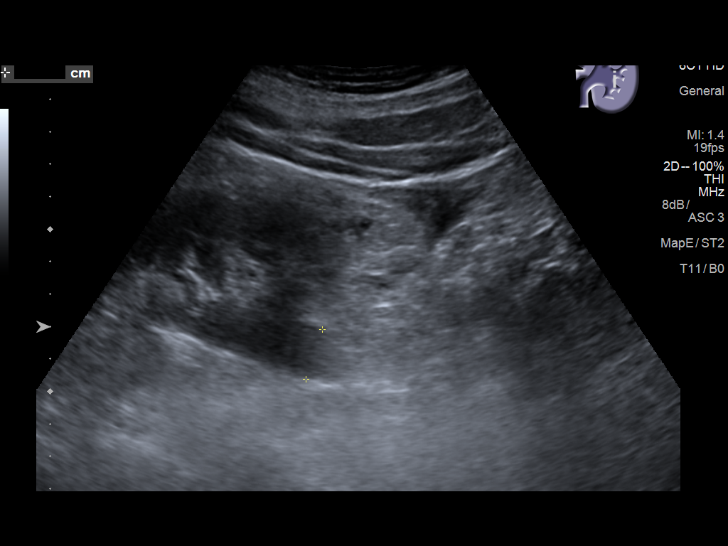
[im 24/39]
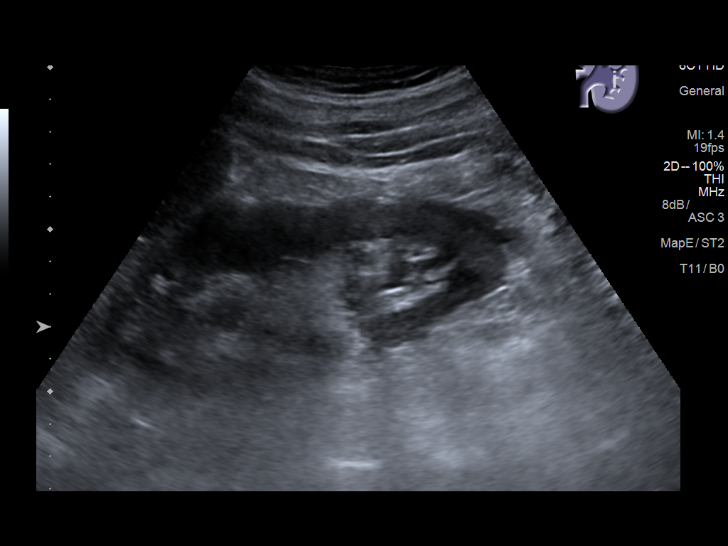
[im 26/39]
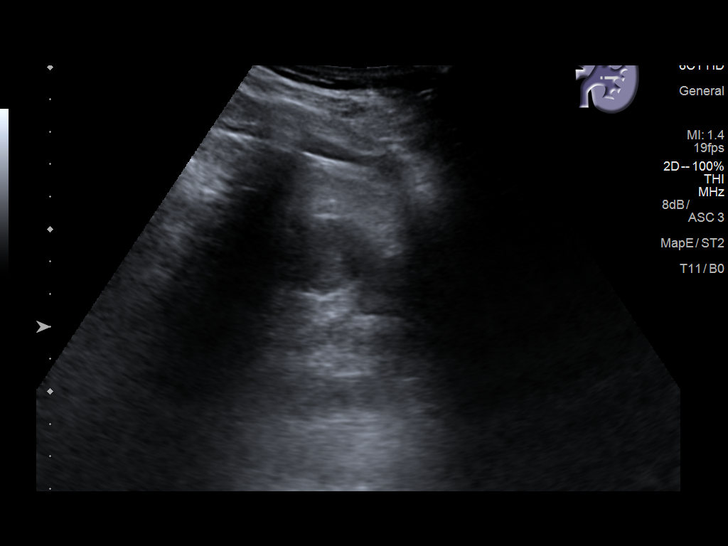
[im 29/39]
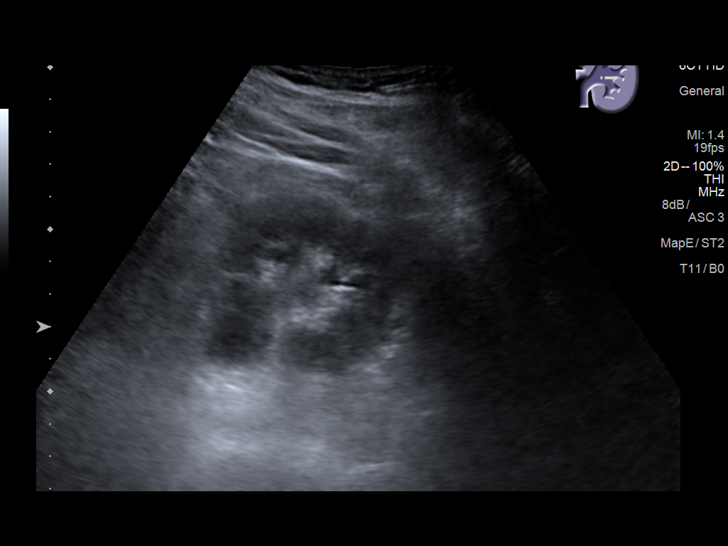
[im 32/39]
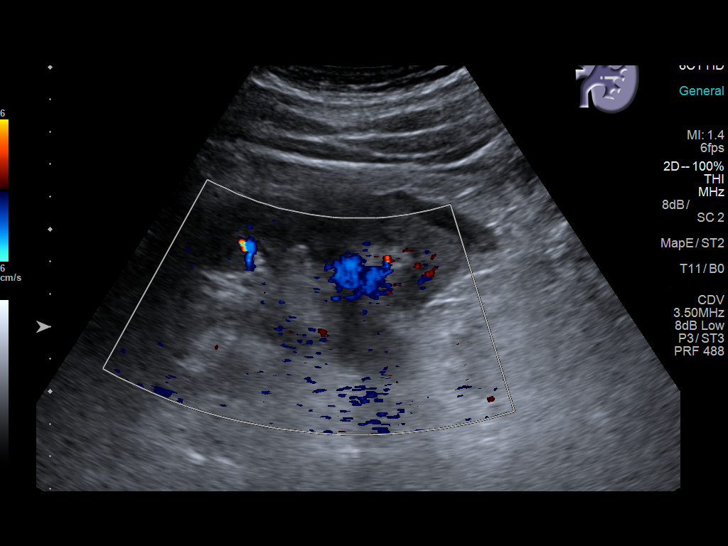
[im 35/39]
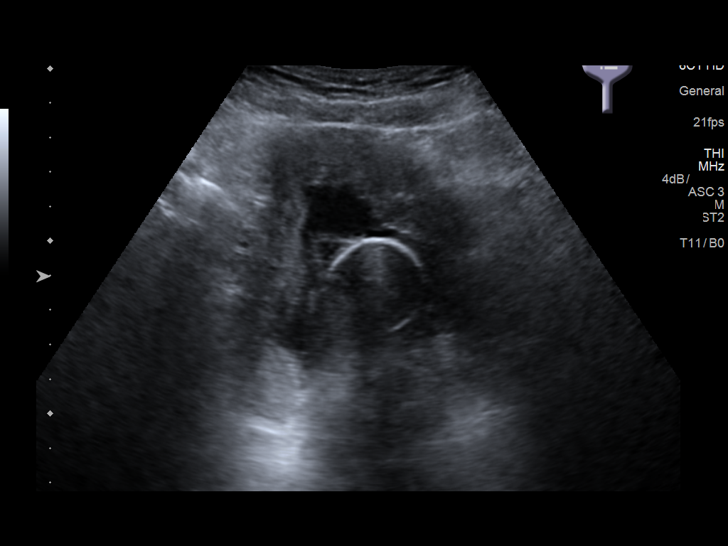
[im 39/39]
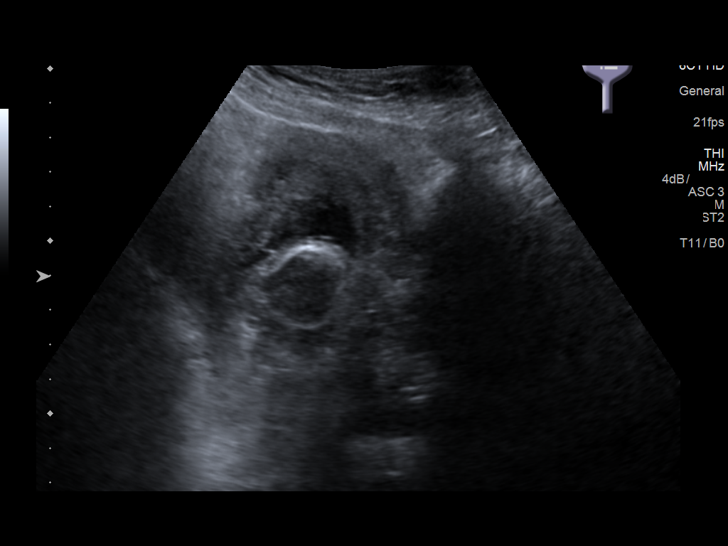

[14 of 25 positions shown; findings below may reference images not displayed]

FINDINGS: Right Kidney:

Length: 12.9 cm. Echogenicity within normal limits. Moderate
right-sided hydronephrosis is present. The proximal right ureter is
dilated as well. No obstructing lesion is evident. Parenchymal
echogenicity is within normal limits.

Left Kidney:

Length: 10.9 cm, within normal limits. Echogenicity within normal
limits. No mass is visualized. A small amount of perinephric fluid
is noted at the lower pole. Mild left-sided hydronephrosis is
present. No obstructing lesion is evident.

Bladder:

A Foley catheter is present.
IMPRESSION: 1. Bilateral hydronephrosis, right greater than left.
2. Mild perinephric fluid at the lower pole of the left kidney.
3. No obstructing lesions are visualized.
4. Foley catheter is present within the bladder.
